# Patient Record
Sex: Female | Born: 1935 | Race: White | Hispanic: No | Smoking: Former smoker
Health system: Southern US, Community
[De-identification: ages and names within clinical notes are randomized; demographics above are authoritative.]

## PROBLEM LIST (undated history)

## (undated) DIAGNOSIS — C19 Malignant neoplasm of rectosigmoid junction: Secondary | ICD-10-CM

## (undated) DIAGNOSIS — Z86010 Personal history of colonic polyps: Secondary | ICD-10-CM

## (undated) DIAGNOSIS — K449 Diaphragmatic hernia without obstruction or gangrene: Secondary | ICD-10-CM

## (undated) DIAGNOSIS — H269 Unspecified cataract: Secondary | ICD-10-CM

## (undated) DIAGNOSIS — D649 Anemia, unspecified: Secondary | ICD-10-CM

## (undated) DIAGNOSIS — Z860101 Personal history of adenomatous and serrated colon polyps: Secondary | ICD-10-CM

## (undated) HISTORY — PX: COLON RESECTION: SHX5231

## (undated) HISTORY — DX: Anemia, unspecified: D64.9

## (undated) HISTORY — DX: Personal history of adenomatous and serrated colon polyps: Z86.0101

## (undated) HISTORY — PX: VAGINAL HYSTERECTOMY: SUR661

## (undated) HISTORY — DX: Malignant neoplasm of rectosigmoid junction: C19

## (undated) HISTORY — PX: RETINAL DETACHMENT SURGERY: SHX105

## (undated) HISTORY — DX: Diaphragmatic hernia without obstruction or gangrene: K44.9

## (undated) HISTORY — DX: Unspecified cataract: H26.9

## (undated) HISTORY — DX: Personal history of colonic polyps: Z86.010

---

## 1999-04-17 ENCOUNTER — Encounter: Payer: Self-pay | Admitting: Geriatric Medicine

## 1999-04-17 ENCOUNTER — Encounter: Admission: RE | Admit: 1999-04-17 | Discharge: 1999-04-17 | Payer: Self-pay | Admitting: Geriatric Medicine

## 1999-05-08 ENCOUNTER — Encounter: Payer: Self-pay | Admitting: Geriatric Medicine

## 1999-05-08 ENCOUNTER — Encounter: Admission: RE | Admit: 1999-05-08 | Discharge: 1999-05-08 | Payer: Self-pay | Admitting: Geriatric Medicine

## 2001-05-29 ENCOUNTER — Encounter: Admission: RE | Admit: 2001-05-29 | Discharge: 2001-05-29 | Payer: Self-pay | Admitting: Geriatric Medicine

## 2001-05-29 ENCOUNTER — Encounter: Payer: Self-pay | Admitting: Geriatric Medicine

## 2001-07-08 ENCOUNTER — Encounter: Payer: Self-pay | Admitting: Orthopedic Surgery

## 2001-07-08 ENCOUNTER — Encounter: Admission: RE | Admit: 2001-07-08 | Discharge: 2001-07-08 | Payer: Self-pay | Admitting: Orthopedic Surgery

## 2003-12-28 ENCOUNTER — Encounter: Admission: RE | Admit: 2003-12-28 | Discharge: 2003-12-28 | Payer: Self-pay | Admitting: Geriatric Medicine

## 2004-03-02 ENCOUNTER — Encounter: Admission: RE | Admit: 2004-03-02 | Discharge: 2004-03-02 | Payer: Self-pay | Admitting: Geriatric Medicine

## 2004-09-05 ENCOUNTER — Encounter: Admission: RE | Admit: 2004-09-05 | Discharge: 2004-09-05 | Payer: Self-pay | Admitting: Geriatric Medicine

## 2005-04-30 ENCOUNTER — Ambulatory Visit (HOSPITAL_COMMUNITY): Admission: RE | Admit: 2005-04-30 | Discharge: 2005-04-30 | Payer: Self-pay | Admitting: Geriatric Medicine

## 2006-02-04 IMAGING — CT CT PELVIS W/ CM
1 of 4 series · 13 of 32 positions shown, 18 images · IV contrast (redicat/h20 & omnipaque [ID])
Comparison: none

CLINICAL DATA: Follow up liver lesion. 
CT ABDOMEN AND PELVIS W/CONTRAST:
TECHNIQUE: Multidetector CT imaging of the abdomen and pelvis was performed following the standard protocol during bolus administration of intravenous contrast.
Contrast:  125 cc Omnipaque 300. 
CT ABDOMEN W/CONTRAST: 
Multidetector helical scans were performed after oral and IV contrast media were given.  This study is compared to images through the liver from CT angio chest of 12/28/03.  As noted on the prior study there are two small sub-cm low attenuation structures in the right lobe of liver.  These are consistent with benign process such as small cysts but are too small to characterize.  These have not changed in the interval.  No additional liver lesion is seen.  No ductal dilatation is noted.  On delayed images both of these areas within the right lobe of liver persist, most consistent with small cysts. 
The remainder of this study shows the lung bases to be clear.  There does appear to be a small hiatal hernia present.  No calcified gallstones are noted.  The pancreas is normal in size.  The adrenal glands and spleen also appear normal.  There is a rounded low attenuation structure in the right mid kidney.  On delayed images this appears well circumscribed and is most consistent with cyst.  No enhancement is seen.  A small low attenuation structure of only 1 cm is noted in the parenchyma of the left mid kidney as well most likely benign in origin.  The pelvocaliceal systems appear normal.  The abdominal aorta is normal in caliber.

[Series 2: — · axial · 0.97mm/px · z∈[-404,-54]mm · 13 of 80 slices shown, 18 images]
[im 5/80  soft-tissue]
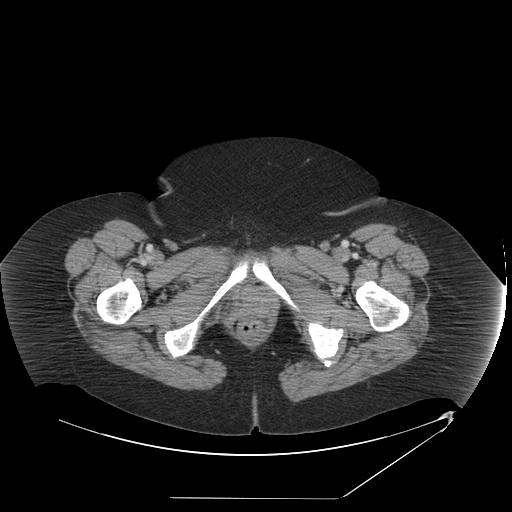
[im 5/80  bone]
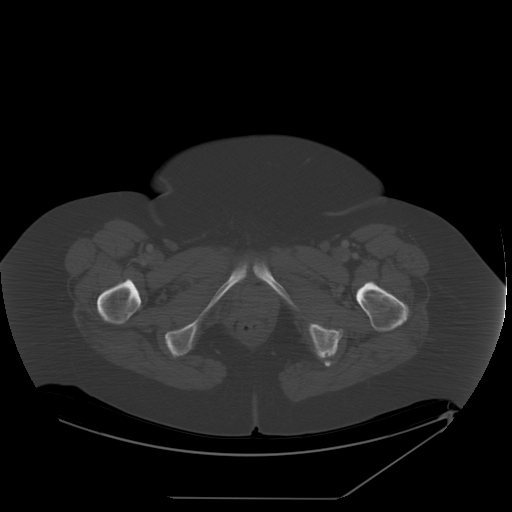
[im 14/80  soft-tissue]
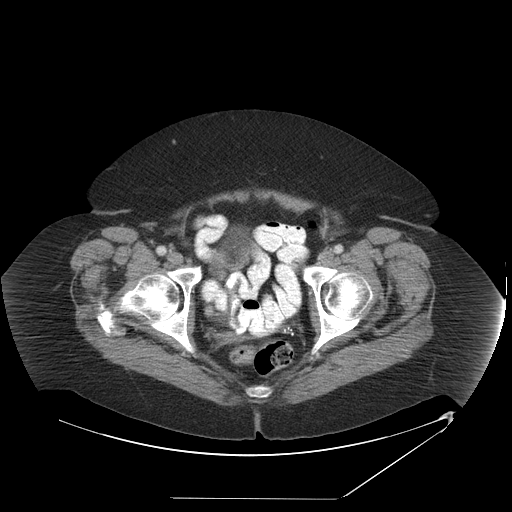
[im 19/80  soft-tissue]
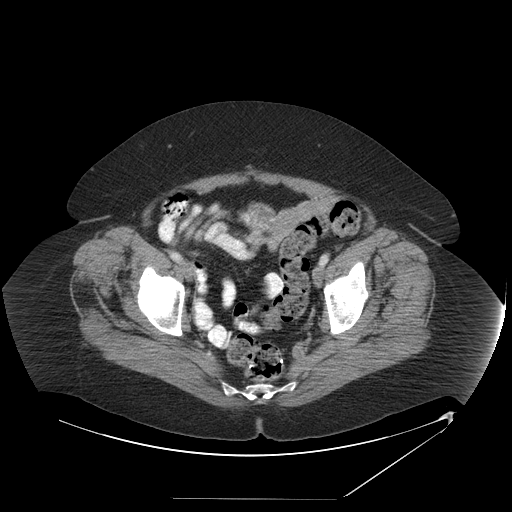
[im 24/80  soft-tissue]
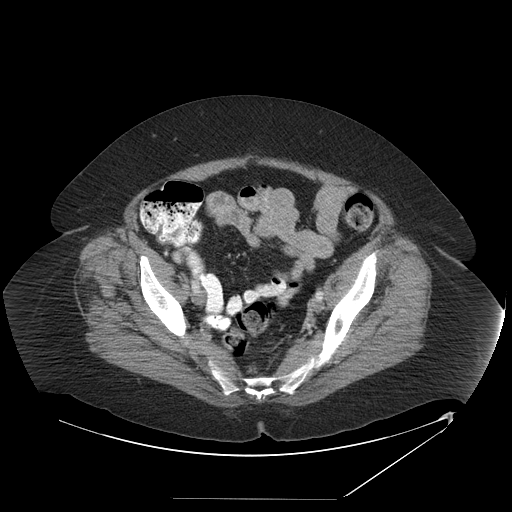
[im 33/80  soft-tissue]
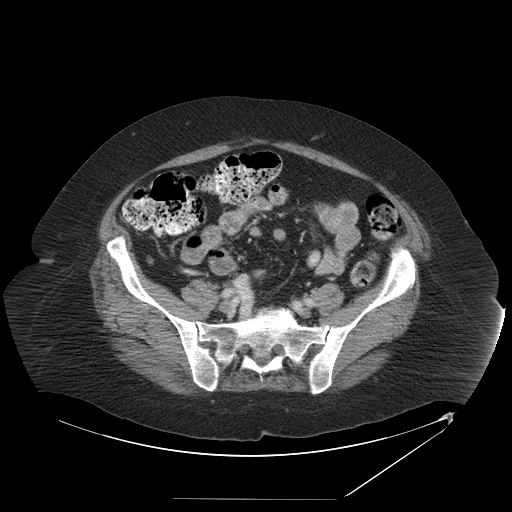
[im 38/80  soft-tissue]
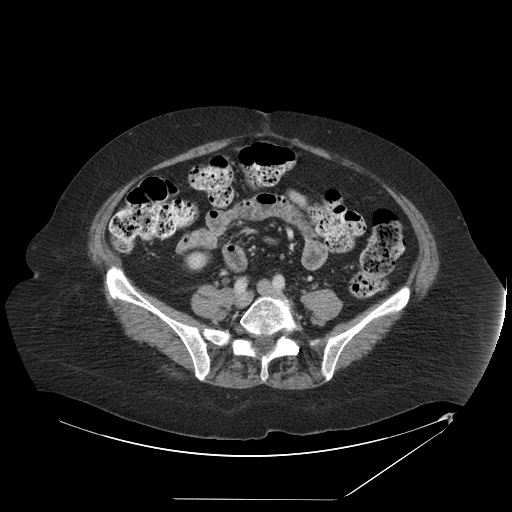
[im 42/80  soft-tissue]
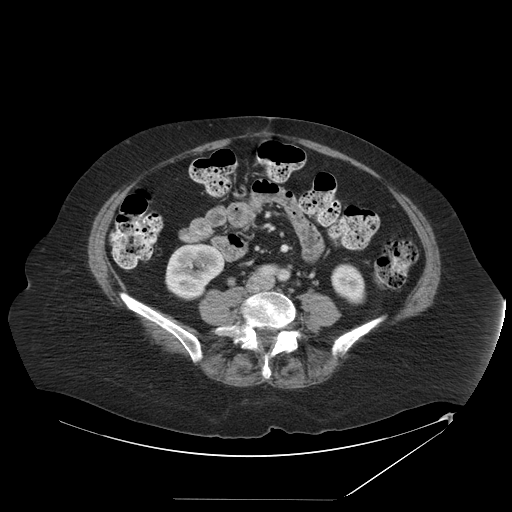
[im 52/80  soft-tissue]
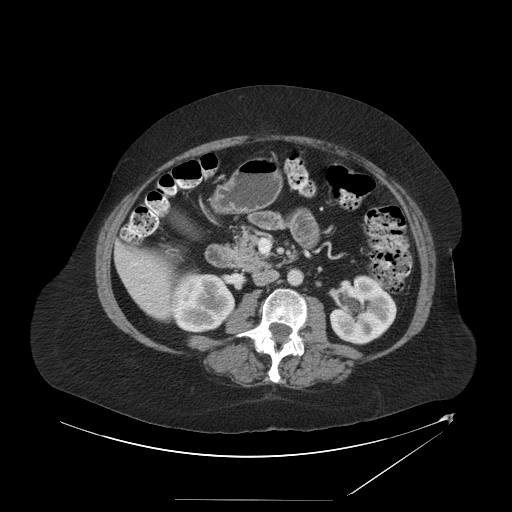
[im 56/80  soft-tissue]
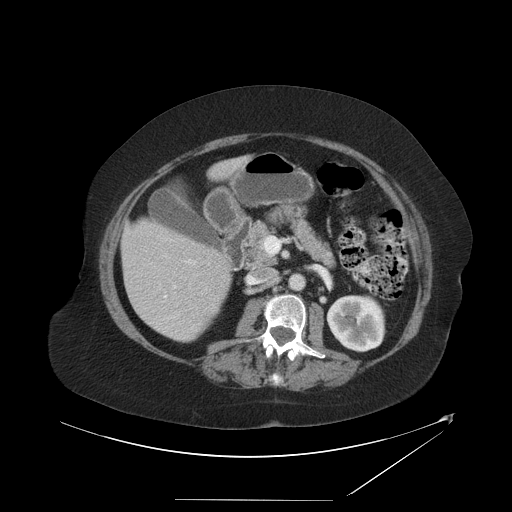
[im 56/80  bone]
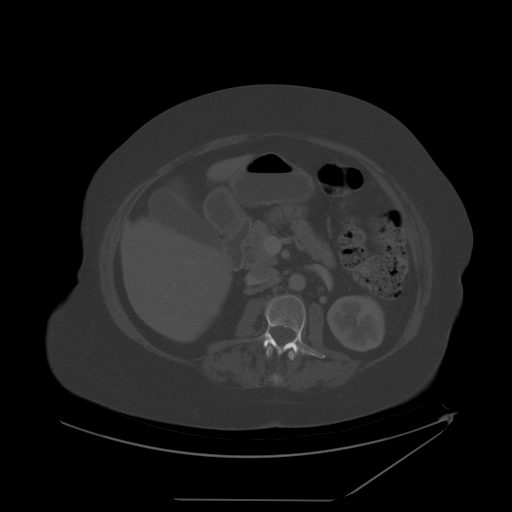
[im 61/80  soft-tissue]
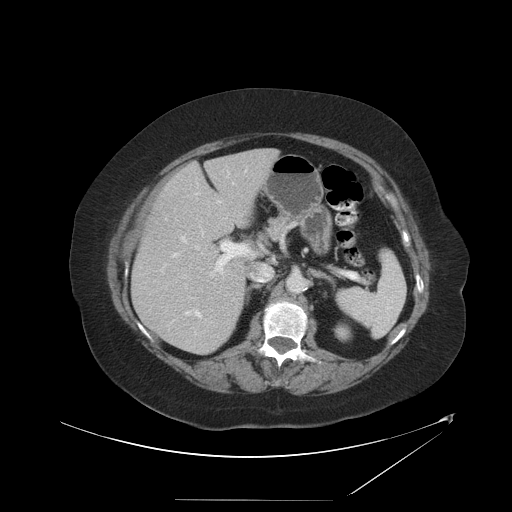
[im 61/80  lung]
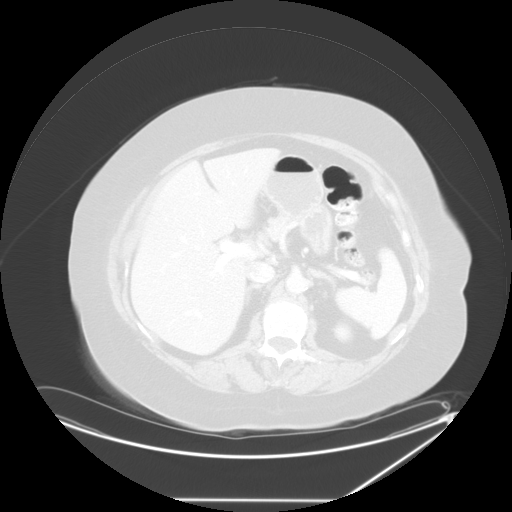
[im 66/80  lung]
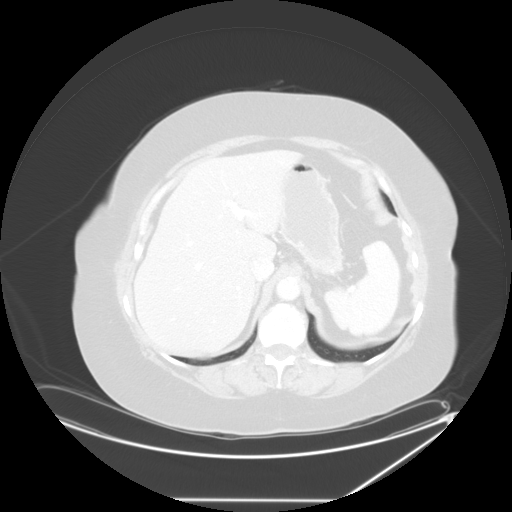
[im 70/80  soft-tissue]
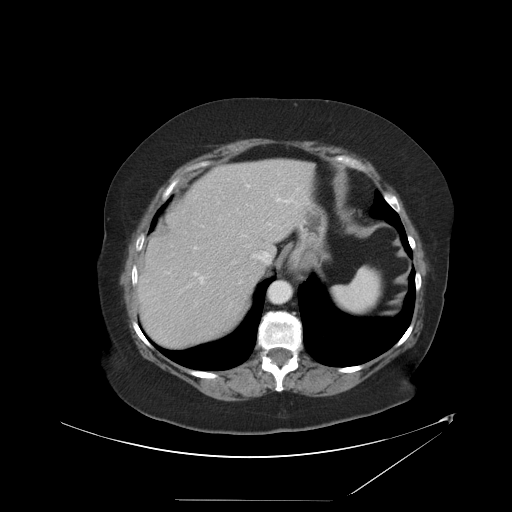
[im 70/80  lung]
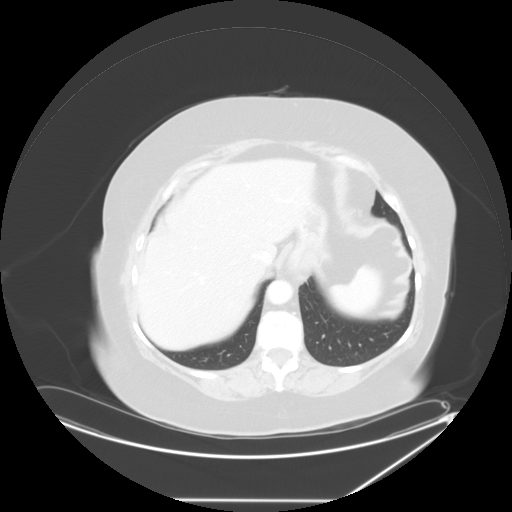
[im 75/80  soft-tissue]
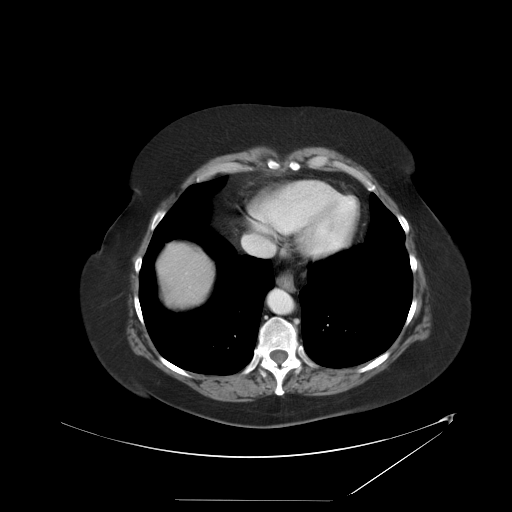
[im 75/80  lung]
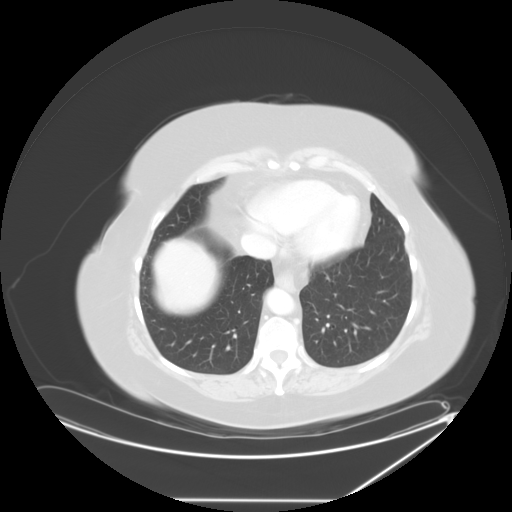

[13 of 32 positions shown; findings below may reference images not displayed]

IMPRESSION: 1.  Two sub-cm low attenuation structures in the posterior right lobe of liver are unchanged on delayed images and most consistent with benign process such as cysts. 
2.  Small hiatal hernia. 
3.  Single rounded low attenuation structures in both kidneys most consistent with cysts. 
CT PELVIS W/CONTRAST: 
Scans were continued through the pelvis after oral and IV contrast media were given.  The appendix appears normal.  Surgical sutures are noted in the rectum.  Urinary bladder is decompressed and unremarkable.  The patient has previously undergone hysterectomy.
IMPRESSION: Negative CT of the pelvis.  Appendix appears normal.

## 2006-05-02 ENCOUNTER — Ambulatory Visit (HOSPITAL_COMMUNITY): Admission: RE | Admit: 2006-05-02 | Discharge: 2006-05-02 | Payer: Self-pay | Admitting: Geriatric Medicine

## 2007-05-05 ENCOUNTER — Ambulatory Visit (HOSPITAL_COMMUNITY): Admission: RE | Admit: 2007-05-05 | Discharge: 2007-05-05 | Payer: Self-pay | Admitting: Geriatric Medicine

## 2007-07-29 ENCOUNTER — Ambulatory Visit: Payer: Self-pay | Admitting: Internal Medicine

## 2007-08-13 ENCOUNTER — Encounter: Payer: Self-pay | Admitting: Internal Medicine

## 2007-08-13 ENCOUNTER — Ambulatory Visit: Payer: Self-pay | Admitting: Internal Medicine

## 2007-08-15 ENCOUNTER — Encounter: Payer: Self-pay | Admitting: Internal Medicine

## 2008-05-16 ENCOUNTER — Ambulatory Visit (HOSPITAL_COMMUNITY): Admission: RE | Admit: 2008-05-16 | Discharge: 2008-05-16 | Payer: Self-pay | Admitting: Geriatric Medicine

## 2009-05-17 ENCOUNTER — Ambulatory Visit (HOSPITAL_COMMUNITY): Admission: RE | Admit: 2009-05-17 | Discharge: 2009-05-17 | Payer: Self-pay | Admitting: Geriatric Medicine

## 2010-04-20 ENCOUNTER — Other Ambulatory Visit (HOSPITAL_COMMUNITY): Payer: Self-pay | Admitting: Geriatric Medicine

## 2010-04-20 DIAGNOSIS — Z1231 Encounter for screening mammogram for malignant neoplasm of breast: Secondary | ICD-10-CM

## 2010-05-23 ENCOUNTER — Ambulatory Visit (HOSPITAL_COMMUNITY)
Admission: RE | Admit: 2010-05-23 | Discharge: 2010-05-23 | Disposition: A | Discharge status: Home / Self Care | Payer: Medicare Other | Source: Ambulatory Visit | Attending: Geriatric Medicine | Admitting: Geriatric Medicine

## 2010-05-23 DIAGNOSIS — Z1231 Encounter for screening mammogram for malignant neoplasm of breast: Secondary | ICD-10-CM | POA: Insufficient documentation

## 2011-01-07 ENCOUNTER — Other Ambulatory Visit: Payer: Self-pay | Admitting: Podiatry

## 2012-05-06 ENCOUNTER — Encounter (INDEPENDENT_AMBULATORY_CARE_PROVIDER_SITE_OTHER): Payer: Medicare Other | Admitting: Ophthalmology

## 2012-05-06 DIAGNOSIS — H4420 Degenerative myopia, unspecified eye: Secondary | ICD-10-CM

## 2012-05-06 DIAGNOSIS — H33309 Unspecified retinal break, unspecified eye: Secondary | ICD-10-CM

## 2012-05-06 DIAGNOSIS — H33009 Unspecified retinal detachment with retinal break, unspecified eye: Secondary | ICD-10-CM

## 2012-05-06 DIAGNOSIS — H43819 Vitreous degeneration, unspecified eye: Secondary | ICD-10-CM

## 2012-05-06 DIAGNOSIS — H2700 Aphakia, unspecified eye: Secondary | ICD-10-CM

## 2012-05-06 DIAGNOSIS — H35349 Macular cyst, hole, or pseudohole, unspecified eye: Secondary | ICD-10-CM

## 2012-06-29 ENCOUNTER — Other Ambulatory Visit (HOSPITAL_COMMUNITY): Payer: Self-pay | Admitting: Geriatric Medicine

## 2012-06-29 DIAGNOSIS — Z1231 Encounter for screening mammogram for malignant neoplasm of breast: Secondary | ICD-10-CM

## 2012-07-02 ENCOUNTER — Ambulatory Visit (HOSPITAL_COMMUNITY)
Admission: RE | Admit: 2012-07-02 | Discharge: 2012-07-02 | Disposition: A | Discharge status: Home / Self Care | Payer: Medicare Other | Source: Ambulatory Visit | Attending: Geriatric Medicine | Admitting: Geriatric Medicine

## 2012-07-02 DIAGNOSIS — Z1231 Encounter for screening mammogram for malignant neoplasm of breast: Secondary | ICD-10-CM | POA: Insufficient documentation

## 2012-07-13 ENCOUNTER — Encounter: Payer: Self-pay | Admitting: *Deleted

## 2012-07-16 ENCOUNTER — Encounter: Payer: Self-pay | Admitting: Internal Medicine

## 2012-08-04 ENCOUNTER — Encounter: Payer: Self-pay | Admitting: *Deleted

## 2012-09-01 ENCOUNTER — Encounter: Payer: Self-pay | Admitting: Internal Medicine

## 2012-09-01 ENCOUNTER — Ambulatory Visit (INDEPENDENT_AMBULATORY_CARE_PROVIDER_SITE_OTHER): Payer: Medicare Other | Admitting: Internal Medicine

## 2012-09-01 VITALS — BP 178/90 | HR 96 | Ht 64.75 in | Wt 245.1 lb

## 2012-09-01 DIAGNOSIS — Z85048 Personal history of other malignant neoplasm of rectum, rectosigmoid junction, and anus: Secondary | ICD-10-CM

## 2012-09-01 MED ORDER — MOVIPREP 100 G PO SOLR: 1.0000 | Freq: Once | ORAL | Status: DC

## 2012-09-01 NOTE — Patient Instructions (Addendum)
You have been scheduled for a colonoscopy with propofol. Please follow written instructions given to you at your visit today.  Please pick up your prep kit at the pharmacy within the next 1-3 days. If you use inhalers (even only as needed), please bring them with you on the day of your procedure. Your physician has requested that you go to www.[email or web address].com and enter the access code given to you at your visit today. This web site gives a general overview about your procedure. However, you should still follow specific instructions given to you by our office regarding your preparation for the procedure.  CC: Dr Pete Glatter

## 2012-09-01 NOTE — Progress Notes (Signed)
Holly Hawkins 08/11/1927 MRN [id number]   History of Present Illness:  This is an 79 year old white female with a history of a sigmoid carcinoma resected in 1991. She underwent subsequent colonoscopies in 1993, 1995, 1997, 1999, 2004 and 2009. She had a tubal adenoma in the cecum on her last colonoscopy. She is here todayat my request to discuss having a recall colonoscopy . She would like to go ahead and schedule her screening colonoscopy at this time. She denies any specific symptoms of rectal bleeding, abdominal pain or constipation. She has been in good health and says that her relatives lived into their 57s and that she would like to have one more colonoscopy.   Past Medical History  Diagnosis Date  . Colorectal cancer   . Hx of adenomatous colonic polyps   . Hiatal hernia    Past Surgical History  Procedure Laterality Date  . Vaginal hysterectomy    . Colon resection    . Retinal detachment surgery Bilateral     x 6    reports that she quit smoking about 54 years ago. Her smoking use included Cigarettes. She smoked 0.00 packs per day. She has never used smokeless tobacco. She reports that  drinks alcohol. She reports that she does not use illicit drugs. family history includes Colon cancer (age of onset: 18) in her mother and Diabetes in her maternal aunt and maternal grandmother. Allergies  Allergen Reactions  . Codeine     REACTION: nausea/vomiting  . Penicillins     REACTION: rash  . Sulfonamide Derivatives     REACTION: edema        Review of Systems:  The remainder of the 10 point ROS is negative except as outlined in H&P   Physical Exam: General appearance  Well developed, in no distress, Mildly overweight. Psychological normal mood and affect.  Assessment and Plan:  Problem #79 79 year old white female with a history of sigmoid adenocarcinoma. It has been 5 years since her last colonoscopy and removal of a cecal adenomatous polyp. We will go ahead with a  followup colonoscopy as per patient's own request. We have discussed the prep, the sedation and the procedure.   09/01/2012 Lina Sar

## 2012-09-16 ENCOUNTER — Encounter: Payer: Self-pay | Admitting: Internal Medicine

## 2012-09-16 ENCOUNTER — Ambulatory Visit (AMBULATORY_SURGERY_CENTER): Payer: Medicare Other | Admitting: Internal Medicine

## 2012-09-16 VITALS — BP 145/68 | HR 68 | Temp 98.3°F | Resp 14 | Ht 64.75 in | Wt 245.0 lb

## 2012-09-16 DIAGNOSIS — Z85048 Personal history of other malignant neoplasm of rectum, rectosigmoid junction, and anus: Secondary | ICD-10-CM

## 2012-09-16 MED ORDER — SODIUM CHLORIDE 0.9 % IV SOLN: 500.0000 mL | INTRAVENOUS | Status: DC

## 2012-09-16 NOTE — Op Note (Signed)
[facility] [address] [place], [address]   COLONOSCOPY PROCEDURE REPORT  PATIENT: Holly, Hawkins  MR#: [id number] BIRTHDATE: 12/18/1927 , 85  yrs. old GENDER: Female ENDOSCOPIST: Hart Carwin, MD REFERRED BY:Hal Pete Glatter, M.D. PROCEDURE DATE:  09/16/2012 PROCEDURE:   Colonoscopy, screening First Screening Colonoscopy - Avg.  risk and is 50 yrs.  old or older - No.  Prior Negative Screening - Now for repeat screening. N/A  History of Adenoma - Now for follow-up colonoscopy & has been > or = to 3 yrs.  N/A  Polyps Removed Today? Yes. ASA CLASS:   Class II INDICATIONS:High risk patient with personal history of rectosigmoid cancer and sigmoid carcinoma 1991, subsequent colonoscopiesin 1993, [id number], 2004,07/2007. MEDICATIONS: MAC sedation, administered by CRNA and propofol (Diprivan) 200mg  IV  DESCRIPTION OF PROCEDURE:   After the risks benefits and alternatives of the procedure were thoroughly explained, informed consent was obtained.  A digital rectal exam revealed no abnormalities of the rectum.   The LB [id number]-H190 [id number] and LB [id number]  endoscope was introduced through the anus and advanced to the cecum, which was identified by both the appendix and ileocecal valve. No adverse events experienced.   The quality of the prep was good, using MoviPrep  The instrument was then slowly withdrawn as the colon was fully examined.      COLON FINDINGS: There was evidence of a prior end-to-end colo-colonic surgical anastomosis in the rectosigmoid colon at 15 cm, lumen was widely open.  Retroflexed views revealed no abnormalities. The time to cecum=5 minutes 11 seconds.  Withdrawal time=6 minutes 08 seconds.  The scope was withdrawn and the procedure completed. COMPLICATIONS: There were no complications.  ENDOSCOPIC IMPRESSION: There was evidence of a prior colo-colonic surgical anastomosis in the rectosigmoid colon no evidence of recurrent  carcinoma ( resected in 1991)  RECOMMENDATIONS: High fiber diet recall colonoscopy 5 years   eSigned:  Hart Carwin, MD 09/16/2012 12:47 PM   cc:   PATIENT NAME:  Holly, Hawkins MR#: [id number]

## 2012-09-16 NOTE — Progress Notes (Signed)
Patient did not experience any of the following events: a burn prior to discharge; a fall within the facility; wrong site/side/patient/procedure/implant event; or a hospital transfer or hospital admission upon discharge from the facility. (G8907) Patient did not have preoperative order for IV antibiotic SSI prophylaxis. (G8918)  

## 2012-09-16 NOTE — Patient Instructions (Addendum)
Impressions/recommendations:  No evidence of recurrent carcimoma  HIgh Fiber diet (handout given) Repeat colonoscopy in 5 years.  YOU HAD AN ENDOSCOPIC PROCEDURE TODAY AT THE [facility]: Refer to the procedure report that was given to you for any specific questions about what was found during the examination.  If the procedure report does not answer your questions, please call your gastroenterologist to clarify.  If you requested that your care partner not be given the details of your procedure findings, then the procedure report has been included in a sealed envelope for you to review at your convenience later.  YOU SHOULD EXPECT: Some feelings of bloating in the abdomen. Passage of more gas than usual.  Walking can help get rid of the air that was put into your GI tract during the procedure and reduce the bloating. If you had a lower endoscopy (such as a colonoscopy or flexible sigmoidoscopy) you may notice spotting of blood in your stool or on the toilet paper. If you underwent a bowel prep for your procedure, then you may not have a normal bowel movement for a few days.  DIET: Your first meal following the procedure should be a light meal and then it is ok to progress to your normal diet.  A half-sandwich or bowl of soup is an example of a good first meal.  Heavy or fried foods are harder to digest and may make you feel nauseous or bloated.  Likewise meals heavy in dairy and vegetables can cause extra gas to form and this can also increase the bloating.  Drink plenty of fluids but you should avoid alcoholic beverages for 24 hours.  ACTIVITY: Your care partner should take you home directly after the procedure.  You should plan to take it easy, moving slowly for the rest of the day.  You can resume normal activity the day after the procedure however you should NOT DRIVE or use heavy machinery for 24 hours (because of the sedation medicines used during the test).    SYMPTOMS TO REPORT  IMMEDIATELY: A gastroenterologist can be reached at any hour.  During normal business hours, 8:30 AM to 5:00 PM Monday through Friday, call [phone].  After hours and on weekends, please call the GI answering service at [phone] who will take a message and have the physician on call contact you.   Following lower endoscopy (colonoscopy or flexible sigmoidoscopy):  Excessive amounts of blood in the stool  Significant tenderness or worsening of abdominal pains  Swelling of the abdomen that is new, acute  Fever of 100F or higher   FOLLOW UP: If any biopsies were taken you will be contacted by phone or by letter within the next 1-3 weeks.  Call your gastroenterologist if you have not heard about the biopsies in 3 weeks.  Our staff will call the home number listed on your records the next business day following your procedure to check on you and address any questions or concerns that you may have at that time regarding the information given to you following your procedure. This is a courtesy call and so if there is no answer at the home number and we have not heard from you through the emergency physician on call, we will assume that you have returned to your regular daily activities without incident.  SIGNATURES/CONFIDENTIALITY: You and/or your care partner have signed paperwork which will be entered into your electronic medical record.  These signatures attest to the fact that that the information  above on your After Visit Summary has been reviewed and is understood.  Full responsibility of the confidentiality of this discharge information lies with you and/or your care-partner. 

## 2012-09-17 ENCOUNTER — Telehealth: Payer: Self-pay

## 2012-09-17 NOTE — Telephone Encounter (Signed)
Left a message on the pt's answering machine for her to call us back if she has any questions or concerns #[phone]. Maw

## 2012-10-06 ENCOUNTER — Other Ambulatory Visit: Payer: Self-pay | Admitting: Dermatology

## 2015-02-23 DIAGNOSIS — H33309 Unspecified retinal break, unspecified eye: Secondary | ICD-10-CM | POA: Diagnosis not present

## 2015-02-23 DIAGNOSIS — H47291 Other optic atrophy, right eye: Secondary | ICD-10-CM | POA: Diagnosis not present

## 2015-02-23 DIAGNOSIS — H5211 Myopia, right eye: Secondary | ICD-10-CM | POA: Diagnosis not present

## 2015-02-23 DIAGNOSIS — H35341 Macular cyst, hole, or pseudohole, right eye: Secondary | ICD-10-CM | POA: Diagnosis not present

## 2015-02-23 DIAGNOSIS — H52221 Regular astigmatism, right eye: Secondary | ICD-10-CM | POA: Diagnosis not present

## 2015-03-20 ENCOUNTER — Encounter (INDEPENDENT_AMBULATORY_CARE_PROVIDER_SITE_OTHER): Payer: PPO | Admitting: Ophthalmology

## 2015-03-20 DIAGNOSIS — H43811 Vitreous degeneration, right eye: Secondary | ICD-10-CM | POA: Diagnosis not present

## 2015-03-20 DIAGNOSIS — H338 Other retinal detachments: Secondary | ICD-10-CM

## 2015-03-20 DIAGNOSIS — H35341 Macular cyst, hole, or pseudohole, right eye: Secondary | ICD-10-CM

## 2015-03-20 DIAGNOSIS — H18222 Idiopathic corneal edema, left eye: Secondary | ICD-10-CM | POA: Diagnosis not present

## 2015-03-20 DIAGNOSIS — H4423 Degenerative myopia, bilateral: Secondary | ICD-10-CM | POA: Diagnosis not present

## 2015-04-20 DIAGNOSIS — Z8669 Personal history of other diseases of the nervous system and sense organs: Secondary | ICD-10-CM | POA: Diagnosis not present

## 2015-04-20 DIAGNOSIS — H4423 Degenerative myopia, bilateral: Secondary | ICD-10-CM | POA: Diagnosis not present

## 2015-04-20 DIAGNOSIS — H35341 Macular cyst, hole, or pseudohole, right eye: Secondary | ICD-10-CM | POA: Diagnosis not present

## 2015-04-27 DIAGNOSIS — L821 Other seborrheic keratosis: Secondary | ICD-10-CM | POA: Diagnosis not present

## 2015-04-27 DIAGNOSIS — D239 Other benign neoplasm of skin, unspecified: Secondary | ICD-10-CM | POA: Diagnosis not present

## 2015-04-27 DIAGNOSIS — D2272 Melanocytic nevi of left lower limb, including hip: Secondary | ICD-10-CM | POA: Diagnosis not present

## 2015-04-27 DIAGNOSIS — Z808 Family history of malignant neoplasm of other organs or systems: Secondary | ICD-10-CM | POA: Diagnosis not present

## 2015-04-27 DIAGNOSIS — Z23 Encounter for immunization: Secondary | ICD-10-CM | POA: Diagnosis not present

## 2015-04-27 DIAGNOSIS — D225 Melanocytic nevi of trunk: Secondary | ICD-10-CM | POA: Diagnosis not present

## 2015-04-27 DIAGNOSIS — W57XXXA Bitten or stung by nonvenomous insect and other nonvenomous arthropods, initial encounter: Secondary | ICD-10-CM | POA: Diagnosis not present

## 2015-05-17 ENCOUNTER — Encounter: Payer: Self-pay | Admitting: Internal Medicine

## 2015-06-09 ENCOUNTER — Encounter (INDEPENDENT_AMBULATORY_CARE_PROVIDER_SITE_OTHER): Payer: PPO | Admitting: Ophthalmology

## 2015-06-09 DIAGNOSIS — H43813 Vitreous degeneration, bilateral: Secondary | ICD-10-CM

## 2015-06-09 DIAGNOSIS — H2703 Aphakia, bilateral: Secondary | ICD-10-CM | POA: Diagnosis not present

## 2015-06-09 DIAGNOSIS — H338 Other retinal detachments: Secondary | ICD-10-CM

## 2015-06-09 DIAGNOSIS — H4423 Degenerative myopia, bilateral: Secondary | ICD-10-CM | POA: Diagnosis not present

## 2015-06-09 DIAGNOSIS — H35341 Macular cyst, hole, or pseudohole, right eye: Secondary | ICD-10-CM | POA: Diagnosis not present

## 2015-07-24 ENCOUNTER — Encounter (INDEPENDENT_AMBULATORY_CARE_PROVIDER_SITE_OTHER): Payer: PPO | Admitting: Ophthalmology

## 2015-07-24 DIAGNOSIS — H4423 Degenerative myopia, bilateral: Secondary | ICD-10-CM | POA: Diagnosis not present

## 2015-07-24 DIAGNOSIS — H338 Other retinal detachments: Secondary | ICD-10-CM

## 2015-07-24 DIAGNOSIS — H43813 Vitreous degeneration, bilateral: Secondary | ICD-10-CM | POA: Diagnosis not present

## 2015-07-24 DIAGNOSIS — H35341 Macular cyst, hole, or pseudohole, right eye: Secondary | ICD-10-CM

## 2015-08-03 DIAGNOSIS — H18422 Band keratopathy, left eye: Secondary | ICD-10-CM | POA: Diagnosis not present

## 2015-08-03 DIAGNOSIS — H1812 Bullous keratopathy, left eye: Secondary | ICD-10-CM | POA: Diagnosis not present

## 2015-08-03 DIAGNOSIS — H16402 Unspecified corneal neovascularization, left eye: Secondary | ICD-10-CM | POA: Diagnosis not present

## 2015-08-03 DIAGNOSIS — H1820 Unspecified corneal edema: Secondary | ICD-10-CM | POA: Diagnosis not present

## 2015-08-04 DIAGNOSIS — H1812 Bullous keratopathy, left eye: Secondary | ICD-10-CM | POA: Diagnosis not present

## 2015-08-10 DIAGNOSIS — H1812 Bullous keratopathy, left eye: Secondary | ICD-10-CM | POA: Diagnosis not present

## 2015-09-07 DIAGNOSIS — Z8669 Personal history of other diseases of the nervous system and sense organs: Secondary | ICD-10-CM | POA: Diagnosis not present

## 2015-09-07 DIAGNOSIS — H35341 Macular cyst, hole, or pseudohole, right eye: Secondary | ICD-10-CM | POA: Diagnosis not present

## 2015-09-28 DIAGNOSIS — Z0000 Encounter for general adult medical examination without abnormal findings: Secondary | ICD-10-CM | POA: Diagnosis not present

## 2015-09-28 DIAGNOSIS — Z6834 Body mass index (BMI) 34.0-34.9, adult: Secondary | ICD-10-CM | POA: Diagnosis not present

## 2015-09-28 DIAGNOSIS — Z79899 Other long term (current) drug therapy: Secondary | ICD-10-CM | POA: Diagnosis not present

## 2015-09-28 DIAGNOSIS — E7800 Pure hypercholesterolemia, unspecified: Secondary | ICD-10-CM | POA: Diagnosis not present

## 2015-09-28 DIAGNOSIS — Z1389 Encounter for screening for other disorder: Secondary | ICD-10-CM | POA: Diagnosis not present

## 2015-09-28 DIAGNOSIS — E669 Obesity, unspecified: Secondary | ICD-10-CM | POA: Diagnosis not present

## 2015-11-16 ENCOUNTER — Other Ambulatory Visit: Payer: Self-pay | Admitting: Geriatric Medicine

## 2015-11-16 DIAGNOSIS — Z1231 Encounter for screening mammogram for malignant neoplasm of breast: Secondary | ICD-10-CM

## 2015-12-01 ENCOUNTER — Ambulatory Visit
Admission: RE | Admit: 2015-12-01 | Discharge: 2015-12-01 | Disposition: A | Discharge status: Home / Self Care | Payer: PPO | Source: Ambulatory Visit | Attending: Geriatric Medicine | Admitting: Geriatric Medicine

## 2015-12-01 DIAGNOSIS — Z1231 Encounter for screening mammogram for malignant neoplasm of breast: Secondary | ICD-10-CM | POA: Diagnosis not present

## 2015-12-07 DIAGNOSIS — H4423 Degenerative myopia, bilateral: Secondary | ICD-10-CM | POA: Diagnosis not present

## 2015-12-07 DIAGNOSIS — H3322 Serous retinal detachment, left eye: Secondary | ICD-10-CM | POA: Diagnosis not present

## 2015-12-07 DIAGNOSIS — Z8669 Personal history of other diseases of the nervous system and sense organs: Secondary | ICD-10-CM | POA: Diagnosis not present

## 2015-12-07 DIAGNOSIS — H35341 Macular cyst, hole, or pseudohole, right eye: Secondary | ICD-10-CM | POA: Diagnosis not present

## 2015-12-07 DIAGNOSIS — H541214 Low vision right eye category 1, blindness left eye category 4: Secondary | ICD-10-CM | POA: Diagnosis not present

## 2016-05-07 DIAGNOSIS — Z808 Family history of malignant neoplasm of other organs or systems: Secondary | ICD-10-CM | POA: Diagnosis not present

## 2016-05-07 DIAGNOSIS — L820 Inflamed seborrheic keratosis: Secondary | ICD-10-CM | POA: Diagnosis not present

## 2016-05-07 DIAGNOSIS — D2272 Melanocytic nevi of left lower limb, including hip: Secondary | ICD-10-CM | POA: Diagnosis not present

## 2016-05-07 DIAGNOSIS — D225 Melanocytic nevi of trunk: Secondary | ICD-10-CM | POA: Diagnosis not present

## 2016-05-07 DIAGNOSIS — L821 Other seborrheic keratosis: Secondary | ICD-10-CM | POA: Diagnosis not present

## 2016-05-07 DIAGNOSIS — D2271 Melanocytic nevi of right lower limb, including hip: Secondary | ICD-10-CM | POA: Diagnosis not present

## 2016-05-07 DIAGNOSIS — D235 Other benign neoplasm of skin of trunk: Secondary | ICD-10-CM | POA: Diagnosis not present

## 2016-06-06 DIAGNOSIS — H35341 Macular cyst, hole, or pseudohole, right eye: Secondary | ICD-10-CM | POA: Diagnosis not present

## 2016-06-06 DIAGNOSIS — H4423 Degenerative myopia, bilateral: Secondary | ICD-10-CM | POA: Diagnosis not present

## 2016-06-06 DIAGNOSIS — Z8669 Personal history of other diseases of the nervous system and sense organs: Secondary | ICD-10-CM | POA: Diagnosis not present

## 2016-06-14 DIAGNOSIS — L259 Unspecified contact dermatitis, unspecified cause: Secondary | ICD-10-CM | POA: Diagnosis not present

## 2016-07-08 DIAGNOSIS — Z8669 Personal history of other diseases of the nervous system and sense organs: Secondary | ICD-10-CM | POA: Diagnosis not present

## 2016-07-08 DIAGNOSIS — H02413 Mechanical ptosis of bilateral eyelids: Secondary | ICD-10-CM | POA: Diagnosis not present

## 2016-07-08 DIAGNOSIS — H541214 Low vision right eye category 1, blindness left eye category 4: Secondary | ICD-10-CM | POA: Diagnosis not present

## 2016-07-08 DIAGNOSIS — H35341 Macular cyst, hole, or pseudohole, right eye: Secondary | ICD-10-CM | POA: Diagnosis not present

## 2016-08-05 DIAGNOSIS — H35041 Retinal micro-aneurysms, unspecified, right eye: Secondary | ICD-10-CM | POA: Diagnosis not present

## 2016-08-05 DIAGNOSIS — H541214 Low vision right eye category 1, blindness left eye category 4: Secondary | ICD-10-CM | POA: Diagnosis not present

## 2016-08-05 DIAGNOSIS — H53132 Sudden visual loss, left eye: Secondary | ICD-10-CM | POA: Diagnosis not present

## 2016-08-13 DIAGNOSIS — H541214 Low vision right eye category 1, blindness left eye category 4: Secondary | ICD-10-CM | POA: Diagnosis not present

## 2016-08-13 DIAGNOSIS — H35041 Retinal micro-aneurysms, unspecified, right eye: Secondary | ICD-10-CM | POA: Diagnosis not present

## 2016-08-13 DIAGNOSIS — H53132 Sudden visual loss, left eye: Secondary | ICD-10-CM | POA: Diagnosis not present

## 2016-08-19 DIAGNOSIS — H35041 Retinal micro-aneurysms, unspecified, right eye: Secondary | ICD-10-CM | POA: Diagnosis not present

## 2016-08-19 DIAGNOSIS — H541214 Low vision right eye category 1, blindness left eye category 4: Secondary | ICD-10-CM | POA: Diagnosis not present

## 2016-08-19 DIAGNOSIS — H53132 Sudden visual loss, left eye: Secondary | ICD-10-CM | POA: Diagnosis not present

## 2016-08-28 DIAGNOSIS — H541214 Low vision right eye category 1, blindness left eye category 4: Secondary | ICD-10-CM | POA: Diagnosis not present

## 2016-08-28 DIAGNOSIS — H35041 Retinal micro-aneurysms, unspecified, right eye: Secondary | ICD-10-CM | POA: Diagnosis not present

## 2016-08-28 DIAGNOSIS — H53132 Sudden visual loss, left eye: Secondary | ICD-10-CM | POA: Diagnosis not present

## 2016-09-03 DIAGNOSIS — H53132 Sudden visual loss, left eye: Secondary | ICD-10-CM | POA: Diagnosis not present

## 2016-09-03 DIAGNOSIS — H35041 Retinal micro-aneurysms, unspecified, right eye: Secondary | ICD-10-CM | POA: Diagnosis not present

## 2016-09-03 DIAGNOSIS — H541214 Low vision right eye category 1, blindness left eye category 4: Secondary | ICD-10-CM | POA: Diagnosis not present

## 2016-09-16 DIAGNOSIS — H541214 Low vision right eye category 1, blindness left eye category 4: Secondary | ICD-10-CM | POA: Diagnosis not present

## 2016-09-16 DIAGNOSIS — H35041 Retinal micro-aneurysms, unspecified, right eye: Secondary | ICD-10-CM | POA: Diagnosis not present

## 2016-09-16 DIAGNOSIS — H53132 Sudden visual loss, left eye: Secondary | ICD-10-CM | POA: Diagnosis not present

## 2016-10-07 DIAGNOSIS — Z79899 Other long term (current) drug therapy: Secondary | ICD-10-CM | POA: Diagnosis not present

## 2016-10-07 DIAGNOSIS — M79662 Pain in left lower leg: Secondary | ICD-10-CM | POA: Diagnosis not present

## 2016-10-07 DIAGNOSIS — E669 Obesity, unspecified: Secondary | ICD-10-CM | POA: Diagnosis not present

## 2016-10-07 DIAGNOSIS — Z1389 Encounter for screening for other disorder: Secondary | ICD-10-CM | POA: Diagnosis not present

## 2016-10-07 DIAGNOSIS — Z23 Encounter for immunization: Secondary | ICD-10-CM | POA: Diagnosis not present

## 2016-10-07 DIAGNOSIS — E049 Nontoxic goiter, unspecified: Secondary | ICD-10-CM | POA: Diagnosis not present

## 2016-10-07 DIAGNOSIS — Z0000 Encounter for general adult medical examination without abnormal findings: Secondary | ICD-10-CM | POA: Diagnosis not present

## 2016-10-07 DIAGNOSIS — E7800 Pure hypercholesterolemia, unspecified: Secondary | ICD-10-CM | POA: Diagnosis not present

## 2016-10-07 DIAGNOSIS — Z6835 Body mass index (BMI) 35.0-35.9, adult: Secondary | ICD-10-CM | POA: Diagnosis not present

## 2016-10-08 ENCOUNTER — Other Ambulatory Visit: Payer: Self-pay | Admitting: Geriatric Medicine

## 2016-10-08 DIAGNOSIS — E049 Nontoxic goiter, unspecified: Secondary | ICD-10-CM

## 2016-10-15 ENCOUNTER — Ambulatory Visit
Admission: RE | Admit: 2016-10-15 | Discharge: 2016-10-15 | Disposition: A | Discharge status: Home / Self Care | Payer: PPO | Source: Ambulatory Visit | Attending: Geriatric Medicine | Admitting: Geriatric Medicine

## 2016-10-15 DIAGNOSIS — E049 Nontoxic goiter, unspecified: Secondary | ICD-10-CM | POA: Diagnosis not present

## 2016-12-02 ENCOUNTER — Encounter (INDEPENDENT_AMBULATORY_CARE_PROVIDER_SITE_OTHER): Payer: PPO | Admitting: Ophthalmology

## 2016-12-03 ENCOUNTER — Other Ambulatory Visit: Payer: Self-pay | Admitting: Geriatric Medicine

## 2016-12-03 DIAGNOSIS — Z1231 Encounter for screening mammogram for malignant neoplasm of breast: Secondary | ICD-10-CM

## 2016-12-04 ENCOUNTER — Encounter (INDEPENDENT_AMBULATORY_CARE_PROVIDER_SITE_OTHER): Payer: PPO | Admitting: Ophthalmology

## 2016-12-04 DIAGNOSIS — H338 Other retinal detachments: Secondary | ICD-10-CM | POA: Diagnosis not present

## 2016-12-04 DIAGNOSIS — H4423 Degenerative myopia, bilateral: Secondary | ICD-10-CM | POA: Diagnosis not present

## 2016-12-04 DIAGNOSIS — H43813 Vitreous degeneration, bilateral: Secondary | ICD-10-CM

## 2016-12-04 DIAGNOSIS — H2703 Aphakia, bilateral: Secondary | ICD-10-CM

## 2016-12-04 DIAGNOSIS — H35341 Macular cyst, hole, or pseudohole, right eye: Secondary | ICD-10-CM

## 2016-12-11 DIAGNOSIS — L820 Inflamed seborrheic keratosis: Secondary | ICD-10-CM | POA: Diagnosis not present

## 2016-12-11 DIAGNOSIS — L821 Other seborrheic keratosis: Secondary | ICD-10-CM | POA: Diagnosis not present

## 2016-12-11 DIAGNOSIS — L570 Actinic keratosis: Secondary | ICD-10-CM | POA: Diagnosis not present

## 2017-01-02 ENCOUNTER — Ambulatory Visit
Admission: RE | Admit: 2017-01-02 | Discharge: 2017-01-02 | Disposition: A | Discharge status: Home / Self Care | Payer: PPO | Source: Ambulatory Visit | Attending: Geriatric Medicine | Admitting: Geriatric Medicine

## 2017-01-02 DIAGNOSIS — Z1231 Encounter for screening mammogram for malignant neoplasm of breast: Secondary | ICD-10-CM

## 2017-05-15 DIAGNOSIS — Z808 Family history of malignant neoplasm of other organs or systems: Secondary | ICD-10-CM | POA: Diagnosis not present

## 2017-05-15 DIAGNOSIS — D235 Other benign neoplasm of skin of trunk: Secondary | ICD-10-CM | POA: Diagnosis not present

## 2017-05-15 DIAGNOSIS — D2271 Melanocytic nevi of right lower limb, including hip: Secondary | ICD-10-CM | POA: Diagnosis not present

## 2017-05-15 DIAGNOSIS — L570 Actinic keratosis: Secondary | ICD-10-CM | POA: Diagnosis not present

## 2017-05-15 DIAGNOSIS — D225 Melanocytic nevi of trunk: Secondary | ICD-10-CM | POA: Diagnosis not present

## 2017-05-15 DIAGNOSIS — D2272 Melanocytic nevi of left lower limb, including hip: Secondary | ICD-10-CM | POA: Diagnosis not present

## 2017-06-05 ENCOUNTER — Encounter (INDEPENDENT_AMBULATORY_CARE_PROVIDER_SITE_OTHER): Payer: PPO | Admitting: Ophthalmology

## 2017-06-05 DIAGNOSIS — H2703 Aphakia, bilateral: Secondary | ICD-10-CM

## 2017-06-05 DIAGNOSIS — H43813 Vitreous degeneration, bilateral: Secondary | ICD-10-CM

## 2017-06-05 DIAGNOSIS — H35341 Macular cyst, hole, or pseudohole, right eye: Secondary | ICD-10-CM | POA: Diagnosis not present

## 2017-06-05 DIAGNOSIS — H338 Other retinal detachments: Secondary | ICD-10-CM

## 2017-06-05 DIAGNOSIS — H4423 Degenerative myopia, bilateral: Secondary | ICD-10-CM | POA: Diagnosis not present

## 2017-07-08 ENCOUNTER — Encounter: Payer: Self-pay | Admitting: Internal Medicine

## 2017-10-13 ENCOUNTER — Other Ambulatory Visit: Payer: Self-pay | Admitting: Geriatric Medicine

## 2017-10-13 DIAGNOSIS — Z23 Encounter for immunization: Secondary | ICD-10-CM | POA: Diagnosis not present

## 2017-10-13 DIAGNOSIS — Z1389 Encounter for screening for other disorder: Secondary | ICD-10-CM | POA: Diagnosis not present

## 2017-10-13 DIAGNOSIS — Z0000 Encounter for general adult medical examination without abnormal findings: Secondary | ICD-10-CM | POA: Diagnosis not present

## 2017-10-13 DIAGNOSIS — E041 Nontoxic single thyroid nodule: Secondary | ICD-10-CM | POA: Diagnosis not present

## 2017-10-13 DIAGNOSIS — R011 Cardiac murmur, unspecified: Secondary | ICD-10-CM | POA: Diagnosis not present

## 2017-10-13 DIAGNOSIS — Z79899 Other long term (current) drug therapy: Secondary | ICD-10-CM | POA: Diagnosis not present

## 2017-10-13 DIAGNOSIS — E669 Obesity, unspecified: Secondary | ICD-10-CM | POA: Diagnosis not present

## 2017-10-15 ENCOUNTER — Other Ambulatory Visit: Payer: PPO

## 2017-10-16 ENCOUNTER — Other Ambulatory Visit (HOSPITAL_COMMUNITY): Payer: Self-pay | Admitting: Geriatric Medicine

## 2017-10-16 ENCOUNTER — Encounter (INDEPENDENT_AMBULATORY_CARE_PROVIDER_SITE_OTHER): Payer: Self-pay

## 2017-10-16 ENCOUNTER — Ambulatory Visit
Admission: RE | Admit: 2017-10-16 | Discharge: 2017-10-16 | Disposition: A | Discharge status: Home / Self Care | Payer: PPO | Source: Ambulatory Visit | Attending: Geriatric Medicine | Admitting: Geriatric Medicine

## 2017-10-16 ENCOUNTER — Other Ambulatory Visit: Payer: Self-pay

## 2017-10-16 ENCOUNTER — Ambulatory Visit (HOSPITAL_COMMUNITY): Payer: PPO | Attending: Cardiology

## 2017-10-16 DIAGNOSIS — E041 Nontoxic single thyroid nodule: Secondary | ICD-10-CM | POA: Diagnosis not present

## 2017-10-16 DIAGNOSIS — E669 Obesity, unspecified: Secondary | ICD-10-CM | POA: Insufficient documentation

## 2017-10-16 DIAGNOSIS — I080 Rheumatic disorders of both mitral and aortic valves: Secondary | ICD-10-CM | POA: Diagnosis not present

## 2017-10-16 DIAGNOSIS — Z6841 Body Mass Index (BMI) 40.0 and over, adult: Secondary | ICD-10-CM | POA: Insufficient documentation

## 2017-10-16 DIAGNOSIS — R011 Cardiac murmur, unspecified: Secondary | ICD-10-CM

## 2017-12-18 ENCOUNTER — Encounter (INDEPENDENT_AMBULATORY_CARE_PROVIDER_SITE_OTHER): Payer: PPO | Admitting: Ophthalmology

## 2017-12-18 DIAGNOSIS — H43813 Vitreous degeneration, bilateral: Secondary | ICD-10-CM | POA: Diagnosis not present

## 2017-12-18 DIAGNOSIS — H35341 Macular cyst, hole, or pseudohole, right eye: Secondary | ICD-10-CM

## 2017-12-18 DIAGNOSIS — H2703 Aphakia, bilateral: Secondary | ICD-10-CM | POA: Diagnosis not present

## 2017-12-18 DIAGNOSIS — H338 Other retinal detachments: Secondary | ICD-10-CM | POA: Diagnosis not present

## 2018-01-29 DIAGNOSIS — H33321 Round hole, right eye: Secondary | ICD-10-CM | POA: Diagnosis not present

## 2018-01-29 DIAGNOSIS — H52221 Regular astigmatism, right eye: Secondary | ICD-10-CM | POA: Diagnosis not present

## 2018-01-29 DIAGNOSIS — H33301 Unspecified retinal break, right eye: Secondary | ICD-10-CM | POA: Diagnosis not present

## 2018-01-29 DIAGNOSIS — H35371 Puckering of macula, right eye: Secondary | ICD-10-CM | POA: Diagnosis not present

## 2018-01-29 DIAGNOSIS — H35341 Macular cyst, hole, or pseudohole, right eye: Secondary | ICD-10-CM | POA: Diagnosis not present

## 2018-01-29 DIAGNOSIS — H5211 Myopia, right eye: Secondary | ICD-10-CM | POA: Diagnosis not present

## 2018-02-16 DIAGNOSIS — R69 Illness, unspecified: Secondary | ICD-10-CM | POA: Diagnosis not present

## 2018-03-31 DIAGNOSIS — Z0100 Encounter for examination of eyes and vision without abnormal findings: Secondary | ICD-10-CM | POA: Diagnosis not present

## 2018-09-17 ENCOUNTER — Encounter (INDEPENDENT_AMBULATORY_CARE_PROVIDER_SITE_OTHER): Payer: PPO | Admitting: Ophthalmology

## 2018-09-17 ENCOUNTER — Other Ambulatory Visit: Payer: Self-pay

## 2018-09-17 DIAGNOSIS — H35341 Macular cyst, hole, or pseudohole, right eye: Secondary | ICD-10-CM | POA: Diagnosis not present

## 2018-09-17 DIAGNOSIS — H4423 Degenerative myopia, bilateral: Secondary | ICD-10-CM

## 2018-09-17 DIAGNOSIS — H2703 Aphakia, bilateral: Secondary | ICD-10-CM | POA: Diagnosis not present

## 2018-09-17 DIAGNOSIS — H338 Other retinal detachments: Secondary | ICD-10-CM | POA: Diagnosis not present

## 2018-10-16 DIAGNOSIS — R69 Illness, unspecified: Secondary | ICD-10-CM | POA: Diagnosis not present

## 2018-12-01 ENCOUNTER — Other Ambulatory Visit: Payer: Self-pay | Admitting: Geriatric Medicine

## 2018-12-01 DIAGNOSIS — Z79899 Other long term (current) drug therapy: Secondary | ICD-10-CM | POA: Diagnosis not present

## 2018-12-01 DIAGNOSIS — E041 Nontoxic single thyroid nodule: Secondary | ICD-10-CM

## 2018-12-01 DIAGNOSIS — R030 Elevated blood-pressure reading, without diagnosis of hypertension: Secondary | ICD-10-CM | POA: Diagnosis not present

## 2018-12-01 DIAGNOSIS — M25562 Pain in left knee: Secondary | ICD-10-CM | POA: Diagnosis not present

## 2018-12-01 DIAGNOSIS — Z0000 Encounter for general adult medical examination without abnormal findings: Secondary | ICD-10-CM | POA: Diagnosis not present

## 2018-12-01 DIAGNOSIS — Z1389 Encounter for screening for other disorder: Secondary | ICD-10-CM | POA: Diagnosis not present

## 2018-12-01 DIAGNOSIS — I872 Venous insufficiency (chronic) (peripheral): Secondary | ICD-10-CM | POA: Diagnosis not present

## 2018-12-01 DIAGNOSIS — Z1231 Encounter for screening mammogram for malignant neoplasm of breast: Secondary | ICD-10-CM

## 2018-12-01 DIAGNOSIS — E7800 Pure hypercholesterolemia, unspecified: Secondary | ICD-10-CM | POA: Diagnosis not present

## 2018-12-07 ENCOUNTER — Ambulatory Visit
Admission: RE | Admit: 2018-12-07 | Discharge: 2018-12-07 | Disposition: A | Discharge status: Home / Self Care | Payer: PPO | Source: Ambulatory Visit | Attending: Geriatric Medicine | Admitting: Geriatric Medicine

## 2018-12-07 DIAGNOSIS — E042 Nontoxic multinodular goiter: Secondary | ICD-10-CM | POA: Diagnosis not present

## 2018-12-07 DIAGNOSIS — E041 Nontoxic single thyroid nodule: Secondary | ICD-10-CM

## 2019-01-20 ENCOUNTER — Ambulatory Visit: Payer: PPO

## 2019-02-22 ENCOUNTER — Ambulatory Visit: Payer: Medicare HMO

## 2019-04-02 ENCOUNTER — Ambulatory Visit: Payer: Medicare HMO

## 2019-05-10 ENCOUNTER — Ambulatory Visit
Admission: RE | Admit: 2019-05-10 | Discharge: 2019-05-10 | Disposition: A | Discharge status: Home / Self Care | Payer: Medicare HMO | Source: Ambulatory Visit | Attending: Geriatric Medicine | Admitting: Geriatric Medicine

## 2019-05-10 ENCOUNTER — Other Ambulatory Visit: Payer: Self-pay

## 2019-05-10 DIAGNOSIS — Z1231 Encounter for screening mammogram for malignant neoplasm of breast: Secondary | ICD-10-CM

## 2019-05-13 DIAGNOSIS — R69 Illness, unspecified: Secondary | ICD-10-CM | POA: Diagnosis not present

## 2019-06-17 ENCOUNTER — Other Ambulatory Visit: Payer: Self-pay

## 2019-06-17 ENCOUNTER — Encounter (INDEPENDENT_AMBULATORY_CARE_PROVIDER_SITE_OTHER): Payer: Medicare HMO | Admitting: Ophthalmology

## 2019-06-17 DIAGNOSIS — H43813 Vitreous degeneration, bilateral: Secondary | ICD-10-CM | POA: Diagnosis not present

## 2019-06-17 DIAGNOSIS — H338 Other retinal detachments: Secondary | ICD-10-CM

## 2019-06-17 DIAGNOSIS — H4423 Degenerative myopia, bilateral: Secondary | ICD-10-CM

## 2019-06-17 DIAGNOSIS — H35341 Macular cyst, hole, or pseudohole, right eye: Secondary | ICD-10-CM | POA: Diagnosis not present

## 2019-06-17 DIAGNOSIS — H2703 Aphakia, bilateral: Secondary | ICD-10-CM | POA: Diagnosis not present

## 2019-06-21 DIAGNOSIS — R69 Illness, unspecified: Secondary | ICD-10-CM | POA: Diagnosis not present

## 2019-10-25 DIAGNOSIS — R69 Illness, unspecified: Secondary | ICD-10-CM | POA: Diagnosis not present

## 2019-12-02 DIAGNOSIS — R69 Illness, unspecified: Secondary | ICD-10-CM | POA: Diagnosis not present

## 2019-12-20 DIAGNOSIS — R69 Illness, unspecified: Secondary | ICD-10-CM | POA: Diagnosis not present

## 2019-12-22 DIAGNOSIS — E041 Nontoxic single thyroid nodule: Secondary | ICD-10-CM | POA: Diagnosis not present

## 2019-12-22 DIAGNOSIS — Z23 Encounter for immunization: Secondary | ICD-10-CM | POA: Diagnosis not present

## 2019-12-22 DIAGNOSIS — Z79899 Other long term (current) drug therapy: Secondary | ICD-10-CM | POA: Diagnosis not present

## 2019-12-22 DIAGNOSIS — H541213 Low vision right eye category 1, blindness left eye category 3: Secondary | ICD-10-CM | POA: Diagnosis not present

## 2019-12-22 DIAGNOSIS — Z1389 Encounter for screening for other disorder: Secondary | ICD-10-CM | POA: Diagnosis not present

## 2019-12-22 DIAGNOSIS — E7800 Pure hypercholesterolemia, unspecified: Secondary | ICD-10-CM | POA: Diagnosis not present

## 2019-12-22 DIAGNOSIS — Z0000 Encounter for general adult medical examination without abnormal findings: Secondary | ICD-10-CM | POA: Diagnosis not present

## 2019-12-22 DIAGNOSIS — M25552 Pain in left hip: Secondary | ICD-10-CM | POA: Diagnosis not present

## 2019-12-31 DIAGNOSIS — E042 Nontoxic multinodular goiter: Secondary | ICD-10-CM | POA: Diagnosis not present

## 2019-12-31 DIAGNOSIS — E041 Nontoxic single thyroid nodule: Secondary | ICD-10-CM | POA: Diagnosis not present

## 2020-03-22 ENCOUNTER — Encounter (INDEPENDENT_AMBULATORY_CARE_PROVIDER_SITE_OTHER): Payer: Medicare HMO | Admitting: Ophthalmology

## 2020-04-19 ENCOUNTER — Encounter (INDEPENDENT_AMBULATORY_CARE_PROVIDER_SITE_OTHER): Payer: Medicare HMO | Admitting: Ophthalmology

## 2020-05-11 DIAGNOSIS — R5383 Other fatigue: Secondary | ICD-10-CM | POA: Diagnosis not present

## 2020-05-11 DIAGNOSIS — R269 Unspecified abnormalities of gait and mobility: Secondary | ICD-10-CM | POA: Diagnosis not present

## 2020-05-11 DIAGNOSIS — R030 Elevated blood-pressure reading, without diagnosis of hypertension: Secondary | ICD-10-CM | POA: Diagnosis not present

## 2020-05-11 DIAGNOSIS — N393 Stress incontinence (female) (male): Secondary | ICD-10-CM | POA: Diagnosis not present

## 2020-05-11 DIAGNOSIS — H542X11 Low vision right eye category 1, low vision left eye category 1: Secondary | ICD-10-CM | POA: Diagnosis not present

## 2020-05-11 DIAGNOSIS — R413 Other amnesia: Secondary | ICD-10-CM | POA: Diagnosis not present

## 2020-05-18 DIAGNOSIS — R739 Hyperglycemia, unspecified: Secondary | ICD-10-CM | POA: Diagnosis not present

## 2020-05-18 DIAGNOSIS — G301 Alzheimer's disease with late onset: Secondary | ICD-10-CM | POA: Diagnosis not present

## 2020-05-18 DIAGNOSIS — I10 Essential (primary) hypertension: Secondary | ICD-10-CM | POA: Diagnosis not present

## 2020-05-18 DIAGNOSIS — H542X11 Low vision right eye category 1, low vision left eye category 1: Secondary | ICD-10-CM | POA: Diagnosis not present

## 2020-05-24 ENCOUNTER — Encounter (INDEPENDENT_AMBULATORY_CARE_PROVIDER_SITE_OTHER): Payer: Medicare HMO | Admitting: Ophthalmology

## 2020-06-27 DIAGNOSIS — Z23 Encounter for immunization: Secondary | ICD-10-CM | POA: Diagnosis not present

## 2020-06-27 DIAGNOSIS — I10 Essential (primary) hypertension: Secondary | ICD-10-CM | POA: Diagnosis not present

## 2020-10-05 DIAGNOSIS — I10 Essential (primary) hypertension: Secondary | ICD-10-CM | POA: Diagnosis not present

## 2020-10-05 DIAGNOSIS — Z8673 Personal history of transient ischemic attack (TIA), and cerebral infarction without residual deficits: Secondary | ICD-10-CM | POA: Diagnosis not present

## 2020-10-05 DIAGNOSIS — Z7722 Contact with and (suspected) exposure to environmental tobacco smoke (acute) (chronic): Secondary | ICD-10-CM | POA: Diagnosis not present

## 2020-10-05 DIAGNOSIS — Z008 Encounter for other general examination: Secondary | ICD-10-CM | POA: Diagnosis not present

## 2020-10-05 DIAGNOSIS — Z809 Family history of malignant neoplasm, unspecified: Secondary | ICD-10-CM | POA: Diagnosis not present

## 2020-10-05 DIAGNOSIS — N393 Stress incontinence (female) (male): Secondary | ICD-10-CM | POA: Diagnosis not present

## 2020-10-05 DIAGNOSIS — Z882 Allergy status to sulfonamides status: Secondary | ICD-10-CM | POA: Diagnosis not present

## 2020-10-05 DIAGNOSIS — R269 Unspecified abnormalities of gait and mobility: Secondary | ICD-10-CM | POA: Diagnosis not present

## 2020-10-05 DIAGNOSIS — Z6835 Body mass index (BMI) 35.0-35.9, adult: Secondary | ICD-10-CM | POA: Diagnosis not present

## 2020-11-08 DIAGNOSIS — I10 Essential (primary) hypertension: Secondary | ICD-10-CM | POA: Diagnosis not present

## 2020-12-14 ENCOUNTER — Ambulatory Visit
Admission: RE | Admit: 2020-12-14 | Discharge: 2020-12-14 | Disposition: A | Discharge status: Home / Self Care | Payer: Medicare HMO | Source: Ambulatory Visit | Attending: Geriatric Medicine | Admitting: Geriatric Medicine

## 2020-12-14 ENCOUNTER — Other Ambulatory Visit: Payer: Self-pay | Admitting: Geriatric Medicine

## 2020-12-14 DIAGNOSIS — M545 Low back pain, unspecified: Secondary | ICD-10-CM

## 2020-12-14 DIAGNOSIS — W19XXXA Unspecified fall, initial encounter: Secondary | ICD-10-CM

## 2020-12-14 DIAGNOSIS — M25551 Pain in right hip: Secondary | ICD-10-CM

## 2020-12-14 DIAGNOSIS — M8588 Other specified disorders of bone density and structure, other site: Secondary | ICD-10-CM | POA: Diagnosis not present

## 2020-12-14 DIAGNOSIS — M25512 Pain in left shoulder: Secondary | ICD-10-CM

## 2020-12-14 DIAGNOSIS — R269 Unspecified abnormalities of gait and mobility: Secondary | ICD-10-CM | POA: Diagnosis not present

## 2020-12-14 DIAGNOSIS — I10 Essential (primary) hypertension: Secondary | ICD-10-CM | POA: Diagnosis not present

## 2020-12-14 DIAGNOSIS — Y92009 Unspecified place in unspecified non-institutional (private) residence as the place of occurrence of the external cause: Secondary | ICD-10-CM

## 2020-12-15 DIAGNOSIS — I872 Venous insufficiency (chronic) (peripheral): Secondary | ICD-10-CM | POA: Diagnosis not present

## 2020-12-15 DIAGNOSIS — Z9049 Acquired absence of other specified parts of digestive tract: Secondary | ICD-10-CM | POA: Diagnosis not present

## 2020-12-15 DIAGNOSIS — F02A0 Dementia in other diseases classified elsewhere, mild, without behavioral disturbance, psychotic disturbance, mood disturbance, and anxiety: Secondary | ICD-10-CM | POA: Diagnosis not present

## 2020-12-15 DIAGNOSIS — I080 Rheumatic disorders of both mitral and aortic valves: Secondary | ICD-10-CM | POA: Diagnosis not present

## 2020-12-15 DIAGNOSIS — I10 Essential (primary) hypertension: Secondary | ICD-10-CM | POA: Diagnosis not present

## 2020-12-15 DIAGNOSIS — M25551 Pain in right hip: Secondary | ICD-10-CM | POA: Diagnosis not present

## 2020-12-15 DIAGNOSIS — Z90710 Acquired absence of both cervix and uterus: Secondary | ICD-10-CM | POA: Diagnosis not present

## 2020-12-15 DIAGNOSIS — Z87891 Personal history of nicotine dependence: Secondary | ICD-10-CM | POA: Diagnosis not present

## 2020-12-15 DIAGNOSIS — I890 Lymphedema, not elsewhere classified: Secondary | ICD-10-CM | POA: Diagnosis not present

## 2020-12-15 DIAGNOSIS — M25512 Pain in left shoulder: Secondary | ICD-10-CM | POA: Diagnosis not present

## 2020-12-15 DIAGNOSIS — R7303 Prediabetes: Secondary | ICD-10-CM | POA: Diagnosis not present

## 2020-12-15 DIAGNOSIS — E041 Nontoxic single thyroid nodule: Secondary | ICD-10-CM | POA: Diagnosis not present

## 2020-12-15 DIAGNOSIS — K7689 Other specified diseases of liver: Secondary | ICD-10-CM | POA: Diagnosis not present

## 2020-12-15 DIAGNOSIS — R69 Illness, unspecified: Secondary | ICD-10-CM | POA: Diagnosis not present

## 2020-12-15 DIAGNOSIS — E7800 Pure hypercholesterolemia, unspecified: Secondary | ICD-10-CM | POA: Diagnosis not present

## 2020-12-15 DIAGNOSIS — K449 Diaphragmatic hernia without obstruction or gangrene: Secondary | ICD-10-CM | POA: Diagnosis not present

## 2020-12-15 DIAGNOSIS — D649 Anemia, unspecified: Secondary | ICD-10-CM | POA: Diagnosis not present

## 2020-12-15 DIAGNOSIS — Z86010 Personal history of colonic polyps: Secondary | ICD-10-CM | POA: Diagnosis not present

## 2020-12-15 DIAGNOSIS — H35341 Macular cyst, hole, or pseudohole, right eye: Secondary | ICD-10-CM | POA: Diagnosis not present

## 2020-12-15 DIAGNOSIS — Z85038 Personal history of other malignant neoplasm of large intestine: Secondary | ICD-10-CM | POA: Diagnosis not present

## 2020-12-15 DIAGNOSIS — M545 Low back pain, unspecified: Secondary | ICD-10-CM | POA: Diagnosis not present

## 2020-12-15 DIAGNOSIS — I4510 Unspecified right bundle-branch block: Secondary | ICD-10-CM | POA: Diagnosis not present

## 2020-12-15 DIAGNOSIS — G301 Alzheimer's disease with late onset: Secondary | ICD-10-CM | POA: Diagnosis not present

## 2020-12-15 DIAGNOSIS — H543 Unqualified visual loss, both eyes: Secondary | ICD-10-CM | POA: Diagnosis not present

## 2020-12-18 DIAGNOSIS — G301 Alzheimer's disease with late onset: Secondary | ICD-10-CM | POA: Diagnosis not present

## 2020-12-18 DIAGNOSIS — I10 Essential (primary) hypertension: Secondary | ICD-10-CM | POA: Diagnosis not present

## 2020-12-18 DIAGNOSIS — R69 Illness, unspecified: Secondary | ICD-10-CM | POA: Diagnosis not present

## 2020-12-18 DIAGNOSIS — K449 Diaphragmatic hernia without obstruction or gangrene: Secondary | ICD-10-CM | POA: Diagnosis not present

## 2020-12-18 DIAGNOSIS — M25512 Pain in left shoulder: Secondary | ICD-10-CM | POA: Diagnosis not present

## 2020-12-18 DIAGNOSIS — I4510 Unspecified right bundle-branch block: Secondary | ICD-10-CM | POA: Diagnosis not present

## 2020-12-18 DIAGNOSIS — I080 Rheumatic disorders of both mitral and aortic valves: Secondary | ICD-10-CM | POA: Diagnosis not present

## 2020-12-18 DIAGNOSIS — M545 Low back pain, unspecified: Secondary | ICD-10-CM | POA: Diagnosis not present

## 2020-12-18 DIAGNOSIS — I872 Venous insufficiency (chronic) (peripheral): Secondary | ICD-10-CM | POA: Diagnosis not present

## 2020-12-18 DIAGNOSIS — M25551 Pain in right hip: Secondary | ICD-10-CM | POA: Diagnosis not present

## 2020-12-26 DIAGNOSIS — M545 Low back pain, unspecified: Secondary | ICD-10-CM | POA: Diagnosis not present

## 2020-12-26 DIAGNOSIS — I4510 Unspecified right bundle-branch block: Secondary | ICD-10-CM | POA: Diagnosis not present

## 2020-12-26 DIAGNOSIS — E7800 Pure hypercholesterolemia, unspecified: Secondary | ICD-10-CM | POA: Diagnosis not present

## 2020-12-26 DIAGNOSIS — I872 Venous insufficiency (chronic) (peripheral): Secondary | ICD-10-CM | POA: Diagnosis not present

## 2020-12-26 DIAGNOSIS — Z79899 Other long term (current) drug therapy: Secondary | ICD-10-CM | POA: Diagnosis not present

## 2020-12-26 DIAGNOSIS — K449 Diaphragmatic hernia without obstruction or gangrene: Secondary | ICD-10-CM | POA: Diagnosis not present

## 2020-12-26 DIAGNOSIS — I10 Essential (primary) hypertension: Secondary | ICD-10-CM | POA: Diagnosis not present

## 2020-12-26 DIAGNOSIS — M25551 Pain in right hip: Secondary | ICD-10-CM | POA: Diagnosis not present

## 2020-12-26 DIAGNOSIS — R600 Localized edema: Secondary | ICD-10-CM | POA: Diagnosis not present

## 2020-12-26 DIAGNOSIS — M25512 Pain in left shoulder: Secondary | ICD-10-CM | POA: Diagnosis not present

## 2020-12-26 DIAGNOSIS — E049 Nontoxic goiter, unspecified: Secondary | ICD-10-CM | POA: Diagnosis not present

## 2020-12-26 DIAGNOSIS — I080 Rheumatic disorders of both mitral and aortic valves: Secondary | ICD-10-CM | POA: Diagnosis not present

## 2020-12-26 DIAGNOSIS — R7303 Prediabetes: Secondary | ICD-10-CM | POA: Diagnosis not present

## 2020-12-26 DIAGNOSIS — H542X11 Low vision right eye category 1, low vision left eye category 1: Secondary | ICD-10-CM | POA: Diagnosis not present

## 2020-12-26 DIAGNOSIS — R69 Illness, unspecified: Secondary | ICD-10-CM | POA: Diagnosis not present

## 2020-12-26 DIAGNOSIS — Z0000 Encounter for general adult medical examination without abnormal findings: Secondary | ICD-10-CM | POA: Diagnosis not present

## 2020-12-26 DIAGNOSIS — G301 Alzheimer's disease with late onset: Secondary | ICD-10-CM | POA: Diagnosis not present

## 2020-12-27 DIAGNOSIS — M545 Low back pain, unspecified: Secondary | ICD-10-CM | POA: Diagnosis not present

## 2020-12-27 DIAGNOSIS — R69 Illness, unspecified: Secondary | ICD-10-CM | POA: Diagnosis not present

## 2020-12-27 DIAGNOSIS — M25551 Pain in right hip: Secondary | ICD-10-CM | POA: Diagnosis not present

## 2020-12-27 DIAGNOSIS — I080 Rheumatic disorders of both mitral and aortic valves: Secondary | ICD-10-CM | POA: Diagnosis not present

## 2020-12-27 DIAGNOSIS — I4510 Unspecified right bundle-branch block: Secondary | ICD-10-CM | POA: Diagnosis not present

## 2020-12-27 DIAGNOSIS — G301 Alzheimer's disease with late onset: Secondary | ICD-10-CM | POA: Diagnosis not present

## 2020-12-27 DIAGNOSIS — K449 Diaphragmatic hernia without obstruction or gangrene: Secondary | ICD-10-CM | POA: Diagnosis not present

## 2020-12-27 DIAGNOSIS — I872 Venous insufficiency (chronic) (peripheral): Secondary | ICD-10-CM | POA: Diagnosis not present

## 2020-12-27 DIAGNOSIS — I10 Essential (primary) hypertension: Secondary | ICD-10-CM | POA: Diagnosis not present

## 2020-12-27 DIAGNOSIS — M25512 Pain in left shoulder: Secondary | ICD-10-CM | POA: Diagnosis not present

## 2020-12-28 DIAGNOSIS — M25551 Pain in right hip: Secondary | ICD-10-CM | POA: Diagnosis not present

## 2020-12-28 DIAGNOSIS — G301 Alzheimer's disease with late onset: Secondary | ICD-10-CM | POA: Diagnosis not present

## 2020-12-28 DIAGNOSIS — M545 Low back pain, unspecified: Secondary | ICD-10-CM | POA: Diagnosis not present

## 2020-12-28 DIAGNOSIS — K449 Diaphragmatic hernia without obstruction or gangrene: Secondary | ICD-10-CM | POA: Diagnosis not present

## 2020-12-28 DIAGNOSIS — R69 Illness, unspecified: Secondary | ICD-10-CM | POA: Diagnosis not present

## 2020-12-28 DIAGNOSIS — I872 Venous insufficiency (chronic) (peripheral): Secondary | ICD-10-CM | POA: Diagnosis not present

## 2020-12-28 DIAGNOSIS — I4510 Unspecified right bundle-branch block: Secondary | ICD-10-CM | POA: Diagnosis not present

## 2020-12-28 DIAGNOSIS — I10 Essential (primary) hypertension: Secondary | ICD-10-CM | POA: Diagnosis not present

## 2020-12-28 DIAGNOSIS — I080 Rheumatic disorders of both mitral and aortic valves: Secondary | ICD-10-CM | POA: Diagnosis not present

## 2020-12-28 DIAGNOSIS — M25512 Pain in left shoulder: Secondary | ICD-10-CM | POA: Diagnosis not present

## 2020-12-29 DIAGNOSIS — M25551 Pain in right hip: Secondary | ICD-10-CM | POA: Diagnosis not present

## 2020-12-29 DIAGNOSIS — K449 Diaphragmatic hernia without obstruction or gangrene: Secondary | ICD-10-CM | POA: Diagnosis not present

## 2020-12-29 DIAGNOSIS — M545 Low back pain, unspecified: Secondary | ICD-10-CM | POA: Diagnosis not present

## 2020-12-29 DIAGNOSIS — I872 Venous insufficiency (chronic) (peripheral): Secondary | ICD-10-CM | POA: Diagnosis not present

## 2020-12-29 DIAGNOSIS — I4510 Unspecified right bundle-branch block: Secondary | ICD-10-CM | POA: Diagnosis not present

## 2020-12-29 DIAGNOSIS — G301 Alzheimer's disease with late onset: Secondary | ICD-10-CM | POA: Diagnosis not present

## 2020-12-29 DIAGNOSIS — I080 Rheumatic disorders of both mitral and aortic valves: Secondary | ICD-10-CM | POA: Diagnosis not present

## 2020-12-29 DIAGNOSIS — R69 Illness, unspecified: Secondary | ICD-10-CM | POA: Diagnosis not present

## 2020-12-29 DIAGNOSIS — M25512 Pain in left shoulder: Secondary | ICD-10-CM | POA: Diagnosis not present

## 2020-12-29 DIAGNOSIS — I10 Essential (primary) hypertension: Secondary | ICD-10-CM | POA: Diagnosis not present

## 2021-01-01 DIAGNOSIS — I10 Essential (primary) hypertension: Secondary | ICD-10-CM | POA: Diagnosis not present

## 2021-01-01 DIAGNOSIS — K449 Diaphragmatic hernia without obstruction or gangrene: Secondary | ICD-10-CM | POA: Diagnosis not present

## 2021-01-01 DIAGNOSIS — I872 Venous insufficiency (chronic) (peripheral): Secondary | ICD-10-CM | POA: Diagnosis not present

## 2021-01-01 DIAGNOSIS — M545 Low back pain, unspecified: Secondary | ICD-10-CM | POA: Diagnosis not present

## 2021-01-01 DIAGNOSIS — I4510 Unspecified right bundle-branch block: Secondary | ICD-10-CM | POA: Diagnosis not present

## 2021-01-01 DIAGNOSIS — M25551 Pain in right hip: Secondary | ICD-10-CM | POA: Diagnosis not present

## 2021-01-01 DIAGNOSIS — R69 Illness, unspecified: Secondary | ICD-10-CM | POA: Diagnosis not present

## 2021-01-01 DIAGNOSIS — G301 Alzheimer's disease with late onset: Secondary | ICD-10-CM | POA: Diagnosis not present

## 2021-01-01 DIAGNOSIS — M25512 Pain in left shoulder: Secondary | ICD-10-CM | POA: Diagnosis not present

## 2021-01-01 DIAGNOSIS — I080 Rheumatic disorders of both mitral and aortic valves: Secondary | ICD-10-CM | POA: Diagnosis not present

## 2021-01-05 DIAGNOSIS — M545 Low back pain, unspecified: Secondary | ICD-10-CM | POA: Diagnosis not present

## 2021-01-05 DIAGNOSIS — I4510 Unspecified right bundle-branch block: Secondary | ICD-10-CM | POA: Diagnosis not present

## 2021-01-05 DIAGNOSIS — G301 Alzheimer's disease with late onset: Secondary | ICD-10-CM | POA: Diagnosis not present

## 2021-01-05 DIAGNOSIS — M25551 Pain in right hip: Secondary | ICD-10-CM | POA: Diagnosis not present

## 2021-01-05 DIAGNOSIS — I080 Rheumatic disorders of both mitral and aortic valves: Secondary | ICD-10-CM | POA: Diagnosis not present

## 2021-01-05 DIAGNOSIS — I872 Venous insufficiency (chronic) (peripheral): Secondary | ICD-10-CM | POA: Diagnosis not present

## 2021-01-05 DIAGNOSIS — I10 Essential (primary) hypertension: Secondary | ICD-10-CM | POA: Diagnosis not present

## 2021-01-05 DIAGNOSIS — R69 Illness, unspecified: Secondary | ICD-10-CM | POA: Diagnosis not present

## 2021-01-05 DIAGNOSIS — M25512 Pain in left shoulder: Secondary | ICD-10-CM | POA: Diagnosis not present

## 2021-01-05 DIAGNOSIS — K449 Diaphragmatic hernia without obstruction or gangrene: Secondary | ICD-10-CM | POA: Diagnosis not present

## 2021-01-08 DIAGNOSIS — G301 Alzheimer's disease with late onset: Secondary | ICD-10-CM | POA: Diagnosis not present

## 2021-01-08 DIAGNOSIS — M25512 Pain in left shoulder: Secondary | ICD-10-CM | POA: Diagnosis not present

## 2021-01-08 DIAGNOSIS — M25551 Pain in right hip: Secondary | ICD-10-CM | POA: Diagnosis not present

## 2021-01-08 DIAGNOSIS — M545 Low back pain, unspecified: Secondary | ICD-10-CM | POA: Diagnosis not present

## 2021-01-08 DIAGNOSIS — K449 Diaphragmatic hernia without obstruction or gangrene: Secondary | ICD-10-CM | POA: Diagnosis not present

## 2021-01-08 DIAGNOSIS — I080 Rheumatic disorders of both mitral and aortic valves: Secondary | ICD-10-CM | POA: Diagnosis not present

## 2021-01-08 DIAGNOSIS — R69 Illness, unspecified: Secondary | ICD-10-CM | POA: Diagnosis not present

## 2021-01-08 DIAGNOSIS — I10 Essential (primary) hypertension: Secondary | ICD-10-CM | POA: Diagnosis not present

## 2021-01-08 DIAGNOSIS — I4510 Unspecified right bundle-branch block: Secondary | ICD-10-CM | POA: Diagnosis not present

## 2021-01-08 DIAGNOSIS — I872 Venous insufficiency (chronic) (peripheral): Secondary | ICD-10-CM | POA: Diagnosis not present

## 2021-01-09 DIAGNOSIS — K449 Diaphragmatic hernia without obstruction or gangrene: Secondary | ICD-10-CM | POA: Diagnosis not present

## 2021-01-09 DIAGNOSIS — I872 Venous insufficiency (chronic) (peripheral): Secondary | ICD-10-CM | POA: Diagnosis not present

## 2021-01-09 DIAGNOSIS — M545 Low back pain, unspecified: Secondary | ICD-10-CM | POA: Diagnosis not present

## 2021-01-09 DIAGNOSIS — I080 Rheumatic disorders of both mitral and aortic valves: Secondary | ICD-10-CM | POA: Diagnosis not present

## 2021-01-09 DIAGNOSIS — R69 Illness, unspecified: Secondary | ICD-10-CM | POA: Diagnosis not present

## 2021-01-09 DIAGNOSIS — M25512 Pain in left shoulder: Secondary | ICD-10-CM | POA: Diagnosis not present

## 2021-01-09 DIAGNOSIS — I4510 Unspecified right bundle-branch block: Secondary | ICD-10-CM | POA: Diagnosis not present

## 2021-01-09 DIAGNOSIS — G301 Alzheimer's disease with late onset: Secondary | ICD-10-CM | POA: Diagnosis not present

## 2021-01-09 DIAGNOSIS — M25551 Pain in right hip: Secondary | ICD-10-CM | POA: Diagnosis not present

## 2021-01-09 DIAGNOSIS — I10 Essential (primary) hypertension: Secondary | ICD-10-CM | POA: Diagnosis not present

## 2021-01-10 DIAGNOSIS — I080 Rheumatic disorders of both mitral and aortic valves: Secondary | ICD-10-CM | POA: Diagnosis not present

## 2021-01-10 DIAGNOSIS — I872 Venous insufficiency (chronic) (peripheral): Secondary | ICD-10-CM | POA: Diagnosis not present

## 2021-01-10 DIAGNOSIS — M25512 Pain in left shoulder: Secondary | ICD-10-CM | POA: Diagnosis not present

## 2021-01-10 DIAGNOSIS — I10 Essential (primary) hypertension: Secondary | ICD-10-CM | POA: Diagnosis not present

## 2021-01-10 DIAGNOSIS — M545 Low back pain, unspecified: Secondary | ICD-10-CM | POA: Diagnosis not present

## 2021-01-10 DIAGNOSIS — M25551 Pain in right hip: Secondary | ICD-10-CM | POA: Diagnosis not present

## 2021-01-10 DIAGNOSIS — G301 Alzheimer's disease with late onset: Secondary | ICD-10-CM | POA: Diagnosis not present

## 2021-01-10 DIAGNOSIS — K449 Diaphragmatic hernia without obstruction or gangrene: Secondary | ICD-10-CM | POA: Diagnosis not present

## 2021-01-10 DIAGNOSIS — I4510 Unspecified right bundle-branch block: Secondary | ICD-10-CM | POA: Diagnosis not present

## 2021-01-10 DIAGNOSIS — R69 Illness, unspecified: Secondary | ICD-10-CM | POA: Diagnosis not present

## 2021-01-11 DIAGNOSIS — G301 Alzheimer's disease with late onset: Secondary | ICD-10-CM | POA: Diagnosis not present

## 2021-01-11 DIAGNOSIS — I10 Essential (primary) hypertension: Secondary | ICD-10-CM | POA: Diagnosis not present

## 2021-01-11 DIAGNOSIS — I4510 Unspecified right bundle-branch block: Secondary | ICD-10-CM | POA: Diagnosis not present

## 2021-01-11 DIAGNOSIS — M545 Low back pain, unspecified: Secondary | ICD-10-CM | POA: Diagnosis not present

## 2021-01-11 DIAGNOSIS — M25551 Pain in right hip: Secondary | ICD-10-CM | POA: Diagnosis not present

## 2021-01-11 DIAGNOSIS — I080 Rheumatic disorders of both mitral and aortic valves: Secondary | ICD-10-CM | POA: Diagnosis not present

## 2021-01-11 DIAGNOSIS — K449 Diaphragmatic hernia without obstruction or gangrene: Secondary | ICD-10-CM | POA: Diagnosis not present

## 2021-01-11 DIAGNOSIS — M25512 Pain in left shoulder: Secondary | ICD-10-CM | POA: Diagnosis not present

## 2021-01-11 DIAGNOSIS — I872 Venous insufficiency (chronic) (peripheral): Secondary | ICD-10-CM | POA: Diagnosis not present

## 2021-01-11 DIAGNOSIS — R69 Illness, unspecified: Secondary | ICD-10-CM | POA: Diagnosis not present

## 2021-01-17 DIAGNOSIS — G301 Alzheimer's disease with late onset: Secondary | ICD-10-CM | POA: Diagnosis not present

## 2021-01-17 DIAGNOSIS — I4510 Unspecified right bundle-branch block: Secondary | ICD-10-CM | POA: Diagnosis not present

## 2021-01-17 DIAGNOSIS — I10 Essential (primary) hypertension: Secondary | ICD-10-CM | POA: Diagnosis not present

## 2021-01-17 DIAGNOSIS — M25512 Pain in left shoulder: Secondary | ICD-10-CM | POA: Diagnosis not present

## 2021-01-17 DIAGNOSIS — M545 Low back pain, unspecified: Secondary | ICD-10-CM | POA: Diagnosis not present

## 2021-01-17 DIAGNOSIS — R69 Illness, unspecified: Secondary | ICD-10-CM | POA: Diagnosis not present

## 2021-01-17 DIAGNOSIS — I080 Rheumatic disorders of both mitral and aortic valves: Secondary | ICD-10-CM | POA: Diagnosis not present

## 2021-01-17 DIAGNOSIS — M25551 Pain in right hip: Secondary | ICD-10-CM | POA: Diagnosis not present

## 2021-01-17 DIAGNOSIS — K449 Diaphragmatic hernia without obstruction or gangrene: Secondary | ICD-10-CM | POA: Diagnosis not present

## 2021-01-17 DIAGNOSIS — I872 Venous insufficiency (chronic) (peripheral): Secondary | ICD-10-CM | POA: Diagnosis not present

## 2021-01-24 DIAGNOSIS — K449 Diaphragmatic hernia without obstruction or gangrene: Secondary | ICD-10-CM | POA: Diagnosis not present

## 2021-01-24 DIAGNOSIS — I4510 Unspecified right bundle-branch block: Secondary | ICD-10-CM | POA: Diagnosis not present

## 2021-01-24 DIAGNOSIS — I872 Venous insufficiency (chronic) (peripheral): Secondary | ICD-10-CM | POA: Diagnosis not present

## 2021-01-24 DIAGNOSIS — I080 Rheumatic disorders of both mitral and aortic valves: Secondary | ICD-10-CM | POA: Diagnosis not present

## 2021-01-24 DIAGNOSIS — M25551 Pain in right hip: Secondary | ICD-10-CM | POA: Diagnosis not present

## 2021-01-24 DIAGNOSIS — G301 Alzheimer's disease with late onset: Secondary | ICD-10-CM | POA: Diagnosis not present

## 2021-01-24 DIAGNOSIS — R69 Illness, unspecified: Secondary | ICD-10-CM | POA: Diagnosis not present

## 2021-01-24 DIAGNOSIS — M545 Low back pain, unspecified: Secondary | ICD-10-CM | POA: Diagnosis not present

## 2021-01-24 DIAGNOSIS — M25512 Pain in left shoulder: Secondary | ICD-10-CM | POA: Diagnosis not present

## 2021-01-24 DIAGNOSIS — I10 Essential (primary) hypertension: Secondary | ICD-10-CM | POA: Diagnosis not present

## 2021-01-25 DIAGNOSIS — I080 Rheumatic disorders of both mitral and aortic valves: Secondary | ICD-10-CM | POA: Diagnosis not present

## 2021-01-25 DIAGNOSIS — M545 Low back pain, unspecified: Secondary | ICD-10-CM | POA: Diagnosis not present

## 2021-01-25 DIAGNOSIS — I4510 Unspecified right bundle-branch block: Secondary | ICD-10-CM | POA: Diagnosis not present

## 2021-01-25 DIAGNOSIS — M25551 Pain in right hip: Secondary | ICD-10-CM | POA: Diagnosis not present

## 2021-01-25 DIAGNOSIS — R69 Illness, unspecified: Secondary | ICD-10-CM | POA: Diagnosis not present

## 2021-01-25 DIAGNOSIS — I10 Essential (primary) hypertension: Secondary | ICD-10-CM | POA: Diagnosis not present

## 2021-01-25 DIAGNOSIS — G301 Alzheimer's disease with late onset: Secondary | ICD-10-CM | POA: Diagnosis not present

## 2021-01-25 DIAGNOSIS — M25512 Pain in left shoulder: Secondary | ICD-10-CM | POA: Diagnosis not present

## 2021-01-25 DIAGNOSIS — K449 Diaphragmatic hernia without obstruction or gangrene: Secondary | ICD-10-CM | POA: Diagnosis not present

## 2021-01-25 DIAGNOSIS — I872 Venous insufficiency (chronic) (peripheral): Secondary | ICD-10-CM | POA: Diagnosis not present

## 2021-01-30 DIAGNOSIS — I872 Venous insufficiency (chronic) (peripheral): Secondary | ICD-10-CM | POA: Diagnosis not present

## 2021-01-30 DIAGNOSIS — G301 Alzheimer's disease with late onset: Secondary | ICD-10-CM | POA: Diagnosis not present

## 2021-01-30 DIAGNOSIS — R69 Illness, unspecified: Secondary | ICD-10-CM | POA: Diagnosis not present

## 2021-01-30 DIAGNOSIS — I080 Rheumatic disorders of both mitral and aortic valves: Secondary | ICD-10-CM | POA: Diagnosis not present

## 2021-01-30 DIAGNOSIS — K449 Diaphragmatic hernia without obstruction or gangrene: Secondary | ICD-10-CM | POA: Diagnosis not present

## 2021-01-30 DIAGNOSIS — I4510 Unspecified right bundle-branch block: Secondary | ICD-10-CM | POA: Diagnosis not present

## 2021-01-30 DIAGNOSIS — I10 Essential (primary) hypertension: Secondary | ICD-10-CM | POA: Diagnosis not present

## 2021-01-30 DIAGNOSIS — M25551 Pain in right hip: Secondary | ICD-10-CM | POA: Diagnosis not present

## 2021-01-30 DIAGNOSIS — M545 Low back pain, unspecified: Secondary | ICD-10-CM | POA: Diagnosis not present

## 2021-01-30 DIAGNOSIS — M25512 Pain in left shoulder: Secondary | ICD-10-CM | POA: Diagnosis not present

## 2021-02-01 DIAGNOSIS — I10 Essential (primary) hypertension: Secondary | ICD-10-CM | POA: Diagnosis not present

## 2021-02-01 DIAGNOSIS — I872 Venous insufficiency (chronic) (peripheral): Secondary | ICD-10-CM | POA: Diagnosis not present

## 2021-02-01 DIAGNOSIS — M545 Low back pain, unspecified: Secondary | ICD-10-CM | POA: Diagnosis not present

## 2021-02-01 DIAGNOSIS — M25512 Pain in left shoulder: Secondary | ICD-10-CM | POA: Diagnosis not present

## 2021-02-01 DIAGNOSIS — R69 Illness, unspecified: Secondary | ICD-10-CM | POA: Diagnosis not present

## 2021-02-01 DIAGNOSIS — I4510 Unspecified right bundle-branch block: Secondary | ICD-10-CM | POA: Diagnosis not present

## 2021-02-01 DIAGNOSIS — K449 Diaphragmatic hernia without obstruction or gangrene: Secondary | ICD-10-CM | POA: Diagnosis not present

## 2021-02-01 DIAGNOSIS — I080 Rheumatic disorders of both mitral and aortic valves: Secondary | ICD-10-CM | POA: Diagnosis not present

## 2021-02-01 DIAGNOSIS — G301 Alzheimer's disease with late onset: Secondary | ICD-10-CM | POA: Diagnosis not present

## 2021-02-01 DIAGNOSIS — M25551 Pain in right hip: Secondary | ICD-10-CM | POA: Diagnosis not present

## 2021-02-07 DIAGNOSIS — M545 Low back pain, unspecified: Secondary | ICD-10-CM | POA: Diagnosis not present

## 2021-02-07 DIAGNOSIS — M25512 Pain in left shoulder: Secondary | ICD-10-CM | POA: Diagnosis not present

## 2021-02-07 DIAGNOSIS — I872 Venous insufficiency (chronic) (peripheral): Secondary | ICD-10-CM | POA: Diagnosis not present

## 2021-02-07 DIAGNOSIS — K449 Diaphragmatic hernia without obstruction or gangrene: Secondary | ICD-10-CM | POA: Diagnosis not present

## 2021-02-07 DIAGNOSIS — G301 Alzheimer's disease with late onset: Secondary | ICD-10-CM | POA: Diagnosis not present

## 2021-02-07 DIAGNOSIS — I4510 Unspecified right bundle-branch block: Secondary | ICD-10-CM | POA: Diagnosis not present

## 2021-02-07 DIAGNOSIS — M25551 Pain in right hip: Secondary | ICD-10-CM | POA: Diagnosis not present

## 2021-02-07 DIAGNOSIS — I080 Rheumatic disorders of both mitral and aortic valves: Secondary | ICD-10-CM | POA: Diagnosis not present

## 2021-02-07 DIAGNOSIS — R69 Illness, unspecified: Secondary | ICD-10-CM | POA: Diagnosis not present

## 2021-02-07 DIAGNOSIS — I10 Essential (primary) hypertension: Secondary | ICD-10-CM | POA: Diagnosis not present

## 2021-02-08 DIAGNOSIS — M545 Low back pain, unspecified: Secondary | ICD-10-CM | POA: Diagnosis not present

## 2021-02-08 DIAGNOSIS — I872 Venous insufficiency (chronic) (peripheral): Secondary | ICD-10-CM | POA: Diagnosis not present

## 2021-02-08 DIAGNOSIS — G301 Alzheimer's disease with late onset: Secondary | ICD-10-CM | POA: Diagnosis not present

## 2021-02-08 DIAGNOSIS — I080 Rheumatic disorders of both mitral and aortic valves: Secondary | ICD-10-CM | POA: Diagnosis not present

## 2021-02-08 DIAGNOSIS — M25551 Pain in right hip: Secondary | ICD-10-CM | POA: Diagnosis not present

## 2021-02-08 DIAGNOSIS — M25512 Pain in left shoulder: Secondary | ICD-10-CM | POA: Diagnosis not present

## 2021-02-08 DIAGNOSIS — I4510 Unspecified right bundle-branch block: Secondary | ICD-10-CM | POA: Diagnosis not present

## 2021-02-08 DIAGNOSIS — K449 Diaphragmatic hernia without obstruction or gangrene: Secondary | ICD-10-CM | POA: Diagnosis not present

## 2021-02-08 DIAGNOSIS — I10 Essential (primary) hypertension: Secondary | ICD-10-CM | POA: Diagnosis not present

## 2021-02-08 DIAGNOSIS — R69 Illness, unspecified: Secondary | ICD-10-CM | POA: Diagnosis not present

## 2021-02-14 DIAGNOSIS — K449 Diaphragmatic hernia without obstruction or gangrene: Secondary | ICD-10-CM | POA: Diagnosis not present

## 2021-02-14 DIAGNOSIS — I872 Venous insufficiency (chronic) (peripheral): Secondary | ICD-10-CM | POA: Diagnosis not present

## 2021-02-14 DIAGNOSIS — I4510 Unspecified right bundle-branch block: Secondary | ICD-10-CM | POA: Diagnosis not present

## 2021-02-14 DIAGNOSIS — M25551 Pain in right hip: Secondary | ICD-10-CM | POA: Diagnosis not present

## 2021-02-14 DIAGNOSIS — M545 Low back pain, unspecified: Secondary | ICD-10-CM | POA: Diagnosis not present

## 2021-02-14 DIAGNOSIS — I10 Essential (primary) hypertension: Secondary | ICD-10-CM | POA: Diagnosis not present

## 2021-02-14 DIAGNOSIS — R69 Illness, unspecified: Secondary | ICD-10-CM | POA: Diagnosis not present

## 2021-02-14 DIAGNOSIS — M25512 Pain in left shoulder: Secondary | ICD-10-CM | POA: Diagnosis not present

## 2021-02-14 DIAGNOSIS — I080 Rheumatic disorders of both mitral and aortic valves: Secondary | ICD-10-CM | POA: Diagnosis not present

## 2021-02-14 DIAGNOSIS — G301 Alzheimer's disease with late onset: Secondary | ICD-10-CM | POA: Diagnosis not present

## 2021-02-16 DIAGNOSIS — R69 Illness, unspecified: Secondary | ICD-10-CM | POA: Diagnosis not present

## 2021-02-16 DIAGNOSIS — K449 Diaphragmatic hernia without obstruction or gangrene: Secondary | ICD-10-CM | POA: Diagnosis not present

## 2021-02-16 DIAGNOSIS — I10 Essential (primary) hypertension: Secondary | ICD-10-CM | POA: Diagnosis not present

## 2021-02-16 DIAGNOSIS — M25512 Pain in left shoulder: Secondary | ICD-10-CM | POA: Diagnosis not present

## 2021-02-16 DIAGNOSIS — M25551 Pain in right hip: Secondary | ICD-10-CM | POA: Diagnosis not present

## 2021-02-16 DIAGNOSIS — G301 Alzheimer's disease with late onset: Secondary | ICD-10-CM | POA: Diagnosis not present

## 2021-02-16 DIAGNOSIS — I4510 Unspecified right bundle-branch block: Secondary | ICD-10-CM | POA: Diagnosis not present

## 2021-02-16 DIAGNOSIS — M545 Low back pain, unspecified: Secondary | ICD-10-CM | POA: Diagnosis not present

## 2021-02-16 DIAGNOSIS — I872 Venous insufficiency (chronic) (peripheral): Secondary | ICD-10-CM | POA: Diagnosis not present

## 2021-02-16 DIAGNOSIS — I080 Rheumatic disorders of both mitral and aortic valves: Secondary | ICD-10-CM | POA: Diagnosis not present

## 2021-02-20 DIAGNOSIS — M25512 Pain in left shoulder: Secondary | ICD-10-CM | POA: Diagnosis not present

## 2021-02-20 DIAGNOSIS — M545 Low back pain, unspecified: Secondary | ICD-10-CM | POA: Diagnosis not present

## 2021-02-20 DIAGNOSIS — R69 Illness, unspecified: Secondary | ICD-10-CM | POA: Diagnosis not present

## 2021-02-20 DIAGNOSIS — M25551 Pain in right hip: Secondary | ICD-10-CM | POA: Diagnosis not present

## 2021-02-20 DIAGNOSIS — G301 Alzheimer's disease with late onset: Secondary | ICD-10-CM | POA: Diagnosis not present

## 2021-02-20 DIAGNOSIS — I4510 Unspecified right bundle-branch block: Secondary | ICD-10-CM | POA: Diagnosis not present

## 2021-02-20 DIAGNOSIS — I080 Rheumatic disorders of both mitral and aortic valves: Secondary | ICD-10-CM | POA: Diagnosis not present

## 2021-02-20 DIAGNOSIS — I10 Essential (primary) hypertension: Secondary | ICD-10-CM | POA: Diagnosis not present

## 2021-02-20 DIAGNOSIS — I872 Venous insufficiency (chronic) (peripheral): Secondary | ICD-10-CM | POA: Diagnosis not present

## 2021-02-20 DIAGNOSIS — K449 Diaphragmatic hernia without obstruction or gangrene: Secondary | ICD-10-CM | POA: Diagnosis not present

## 2021-02-21 DIAGNOSIS — M25551 Pain in right hip: Secondary | ICD-10-CM | POA: Diagnosis not present

## 2021-02-21 DIAGNOSIS — M25512 Pain in left shoulder: Secondary | ICD-10-CM | POA: Diagnosis not present

## 2021-02-21 DIAGNOSIS — I4510 Unspecified right bundle-branch block: Secondary | ICD-10-CM | POA: Diagnosis not present

## 2021-02-21 DIAGNOSIS — K449 Diaphragmatic hernia without obstruction or gangrene: Secondary | ICD-10-CM | POA: Diagnosis not present

## 2021-02-21 DIAGNOSIS — M545 Low back pain, unspecified: Secondary | ICD-10-CM | POA: Diagnosis not present

## 2021-02-21 DIAGNOSIS — I10 Essential (primary) hypertension: Secondary | ICD-10-CM | POA: Diagnosis not present

## 2021-02-21 DIAGNOSIS — G301 Alzheimer's disease with late onset: Secondary | ICD-10-CM | POA: Diagnosis not present

## 2021-02-21 DIAGNOSIS — I872 Venous insufficiency (chronic) (peripheral): Secondary | ICD-10-CM | POA: Diagnosis not present

## 2021-02-21 DIAGNOSIS — I080 Rheumatic disorders of both mitral and aortic valves: Secondary | ICD-10-CM | POA: Diagnosis not present

## 2021-02-21 DIAGNOSIS — R69 Illness, unspecified: Secondary | ICD-10-CM | POA: Diagnosis not present

## 2021-02-23 DIAGNOSIS — R69 Illness, unspecified: Secondary | ICD-10-CM | POA: Diagnosis not present

## 2021-02-23 DIAGNOSIS — K449 Diaphragmatic hernia without obstruction or gangrene: Secondary | ICD-10-CM | POA: Diagnosis not present

## 2021-02-23 DIAGNOSIS — I872 Venous insufficiency (chronic) (peripheral): Secondary | ICD-10-CM | POA: Diagnosis not present

## 2021-02-23 DIAGNOSIS — M25551 Pain in right hip: Secondary | ICD-10-CM | POA: Diagnosis not present

## 2021-02-23 DIAGNOSIS — M25512 Pain in left shoulder: Secondary | ICD-10-CM | POA: Diagnosis not present

## 2021-02-23 DIAGNOSIS — I10 Essential (primary) hypertension: Secondary | ICD-10-CM | POA: Diagnosis not present

## 2021-02-23 DIAGNOSIS — M545 Low back pain, unspecified: Secondary | ICD-10-CM | POA: Diagnosis not present

## 2021-02-23 DIAGNOSIS — I4510 Unspecified right bundle-branch block: Secondary | ICD-10-CM | POA: Diagnosis not present

## 2021-02-23 DIAGNOSIS — G301 Alzheimer's disease with late onset: Secondary | ICD-10-CM | POA: Diagnosis not present

## 2021-02-23 DIAGNOSIS — I080 Rheumatic disorders of both mitral and aortic valves: Secondary | ICD-10-CM | POA: Diagnosis not present

## 2021-02-26 NOTE — Progress Notes (Signed)
[facility] [place]  ASSESSMENT / PLAN: Holly Hawkins is a 88 y.o. 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  Venous duplex today shows no greater saphenous vein reflux.  The patient needs compression wrapping, to be applied by home health nurse.  She has a normal peripheral arterial exam and does not need an ABI prior to wrapping.  She would benefit from physical therapy for lymphedema as well.  She can follow-up with me as needed.  My office will coordinate the above.  CHIEF COMPLAINT: Swollen legs  HISTORY OF PRESENT ILLNESS: Holly Hawkins is a 88 y.o. female who presents to clinic for evaluation of a several month history of severe swollen legs bilaterally.  The swelling is limiting her already limited ambulation.  She has great difficulty moving about the house and getting dressed.  She is dependent on assistance for most of her IADLs.  She reports that her mother had a similar problem, which improved with wrapping.  Past Medical History:  Diagnosis Date   Anemia    Cataract    Colorectal cancer ([place])    Hiatal hernia    Hx of adenomatous colonic polyps     Past Surgical History:  Procedure Laterality Date   COLON RESECTION     RETINAL DETACHMENT SURGERY Bilateral    x 6   VAGINAL HYSTERECTOMY      Family History  Problem Relation Age of Onset   Colon cancer Mother 89   Diabetes Maternal Grandmother    Colon cancer Maternal Grandmother    Diabetes Maternal Aunt    Colon polyps Daughter    Breast cancer Neg Hx     Social History   Socioeconomic History   Marital status: Divorced    Spouse name: Not on file   Number of children: 2   Years of education: Not on file   Highest education level: Not on file  Occupational History   Occupation: retired  Tobacco Use   Smoking status: Former    Types: Cigarettes    Quit date: 01/28/1958    Years since  quitting: 63.1   Smokeless tobacco: Never  Substance and Sexual Activity   Alcohol use: Yes    Comment: social   Drug use: No   Sexual activity: Not on file  Other Topics Concern   Not on file  Social History Narrative   Not on file   Social Determinants of Health   Financial Resource Strain: Not on file  Food Insecurity: Not on file  Transportation Needs: Not on file  Physical Activity: Not on file  Stress: Not on file  Social Connections: Not on file  Intimate Partner Violence: Not on file    Allergies  Allergen Reactions   Codeine     REACTION: nausea/vomiting   Penicillins     REACTION: rash   Sulfonamide Derivatives     REACTION: edema    Current Outpatient Medications  Medication Sig Dispense Refill   furosemide (LASIX) 20 MG tablet Take 20 mg by mouth daily.     lisinopril (ZESTRIL) 10 MG tablet Take 10 mg by mouth daily.     triamcinolone (KENALOG) 0.025 % cream SMARTSIG:2 Topical Twice Daily     amLODipine (NORVASC) 10 MG tablet Take 10 mg by mouth daily. (Patient not taking: Reported on 02/27/2021)     Dextran 70-Hypromellose 0.1-0.3 % SOLN 1 drop every 6 (six) hours as needed for Dry  Eyes. (Patient not taking: Reported on 02/27/2021)     prednisoLONE acetate (PRED FORTE) 1 % ophthalmic suspension Apply to eye. (Patient not taking: Reported on 02/27/2021)     No current facility-administered medications for this visit.    REVIEW OF SYSTEMS:  [X]  denotes positive finding, [ ]  denotes negative finding Cardiac  Comments:  Chest pain or chest pressure:    Shortness of breath upon exertion:    Short of breath when lying flat:    Irregular heart rhythm:        Vascular    Pain in calf, thigh, or hip brought on by ambulation:    Pain in feet at night that wakes you up from your sleep:     Blood clot in your veins:    Leg swelling:         Pulmonary    Oxygen at home:    Productive cough:     Wheezing:         Neurologic    Sudden weakness in arms or  legs:     Sudden numbness in arms or legs:     Sudden onset of difficulty speaking or slurred speech:    Temporary loss of vision in one eye:     Problems with dizziness:         Gastrointestinal    Blood in stool:     Vomited blood:         Genitourinary    Burning when urinating:     Blood in urine:        Psychiatric    Major depression:         Hematologic    Bleeding problems:    Problems with blood clotting too easily:        Skin    Rashes or ulcers:        Constitutional    Fever or chills:      PHYSICAL EXAM Vitals:   02/27/21 0856  BP: (!) 158/78  Pulse: (!) 52  Resp: 20  Temp: 98.5 F (36.9 C)  SpO2: 93%  Weight: 239 lb (108.4 kg)  Height: 5\' 4"  (1.626 m)    Constitutional: Elderly, but well appearing. no distress. Appears well nourished.  Neurologic: CN intact. no focal findings. no sensory loss. Psychiatric:  Mood and affect symmetric and appropriate. Eyes:  No icterus. No conjunctival pallor. Ears, nose, throat:  mucous membranes moist. Midline trachea.  Cardiac: regular rate and rhythm.  Respiratory:  unlabored. Abdominal: non-distended.  Peripheral vascular: Severe bilateral lymphedema. Eczematous skin changes in pre-tibial skin bilaterally. Extremity: Severe, bilateral non-pitting edema. No cyanosis. No pallor.  Skin: No gangrene. No ulceration.  Lymphatic: Positive Stemmer's sign. no palpable lymphadenopathy.  PERTINENT LABORATORY AND RADIOLOGIC DATA Right lower extremity venous duplex:  - No evidence of deep vein thrombosis from the common femoral through the  popliteal veins.  - No evidence of superficial venous thrombosis.  - The common femoral vein is not competent.  - The great saphenous and small saphenous veins are competent.   Yevonne Aline. Stanford Breed, MD [facility] Phone Number: [phone] 02/27/2021 10:03 AM  Total time spent on preparing this encounter including chart review, data  review, collecting history, examining the patient, coordinating care for this new patient, 60 minutes.  Portions of this report may have been transcribed using voice recognition software.  Every effort has been made to ensure accuracy; however, inadvertent computerized transcription errors may still be present.

## 2021-02-27 ENCOUNTER — Other Ambulatory Visit: Payer: Self-pay

## 2021-02-27 ENCOUNTER — Encounter: Payer: Self-pay | Admitting: Vascular Surgery

## 2021-02-27 ENCOUNTER — Ambulatory Visit (HOSPITAL_COMMUNITY)
Admission: RE | Admit: 2021-02-27 | Discharge: 2021-02-27 | Disposition: A | Discharge status: Home / Self Care | Payer: Medicare HMO | Source: Ambulatory Visit | Attending: Vascular Surgery | Admitting: Vascular Surgery

## 2021-02-27 ENCOUNTER — Ambulatory Visit: Payer: Medicare HMO | Admitting: Vascular Surgery

## 2021-02-27 VITALS — BP 158/78 | HR 52 | Temp 98.5°F | Resp 20 | Ht 64.0 in | Wt 239.0 lb

## 2021-02-27 DIAGNOSIS — M7989 Other specified soft tissue disorders: Secondary | ICD-10-CM

## 2021-02-27 DIAGNOSIS — I890 Lymphedema, not elsewhere classified: Secondary | ICD-10-CM

## 2021-02-28 DIAGNOSIS — K449 Diaphragmatic hernia without obstruction or gangrene: Secondary | ICD-10-CM | POA: Diagnosis not present

## 2021-02-28 DIAGNOSIS — M25551 Pain in right hip: Secondary | ICD-10-CM | POA: Diagnosis not present

## 2021-02-28 DIAGNOSIS — M25512 Pain in left shoulder: Secondary | ICD-10-CM | POA: Diagnosis not present

## 2021-02-28 DIAGNOSIS — G301 Alzheimer's disease with late onset: Secondary | ICD-10-CM | POA: Diagnosis not present

## 2021-02-28 DIAGNOSIS — I872 Venous insufficiency (chronic) (peripheral): Secondary | ICD-10-CM | POA: Diagnosis not present

## 2021-02-28 DIAGNOSIS — I4510 Unspecified right bundle-branch block: Secondary | ICD-10-CM | POA: Diagnosis not present

## 2021-02-28 DIAGNOSIS — R69 Illness, unspecified: Secondary | ICD-10-CM | POA: Diagnosis not present

## 2021-02-28 DIAGNOSIS — I10 Essential (primary) hypertension: Secondary | ICD-10-CM | POA: Diagnosis not present

## 2021-02-28 DIAGNOSIS — I080 Rheumatic disorders of both mitral and aortic valves: Secondary | ICD-10-CM | POA: Diagnosis not present

## 2021-02-28 DIAGNOSIS — M545 Low back pain, unspecified: Secondary | ICD-10-CM | POA: Diagnosis not present

## 2021-03-02 DIAGNOSIS — M25551 Pain in right hip: Secondary | ICD-10-CM | POA: Diagnosis not present

## 2021-03-02 DIAGNOSIS — M25512 Pain in left shoulder: Secondary | ICD-10-CM | POA: Diagnosis not present

## 2021-03-02 DIAGNOSIS — I10 Essential (primary) hypertension: Secondary | ICD-10-CM | POA: Diagnosis not present

## 2021-03-02 DIAGNOSIS — R69 Illness, unspecified: Secondary | ICD-10-CM | POA: Diagnosis not present

## 2021-03-02 DIAGNOSIS — I4510 Unspecified right bundle-branch block: Secondary | ICD-10-CM | POA: Diagnosis not present

## 2021-03-02 DIAGNOSIS — I872 Venous insufficiency (chronic) (peripheral): Secondary | ICD-10-CM | POA: Diagnosis not present

## 2021-03-02 DIAGNOSIS — G301 Alzheimer's disease with late onset: Secondary | ICD-10-CM | POA: Diagnosis not present

## 2021-03-02 DIAGNOSIS — K449 Diaphragmatic hernia without obstruction or gangrene: Secondary | ICD-10-CM | POA: Diagnosis not present

## 2021-03-02 DIAGNOSIS — I080 Rheumatic disorders of both mitral and aortic valves: Secondary | ICD-10-CM | POA: Diagnosis not present

## 2021-03-02 DIAGNOSIS — M545 Low back pain, unspecified: Secondary | ICD-10-CM | POA: Diagnosis not present

## 2021-03-06 DIAGNOSIS — M25512 Pain in left shoulder: Secondary | ICD-10-CM | POA: Diagnosis not present

## 2021-03-06 DIAGNOSIS — G301 Alzheimer's disease with late onset: Secondary | ICD-10-CM | POA: Diagnosis not present

## 2021-03-06 DIAGNOSIS — M545 Low back pain, unspecified: Secondary | ICD-10-CM | POA: Diagnosis not present

## 2021-03-06 DIAGNOSIS — R69 Illness, unspecified: Secondary | ICD-10-CM | POA: Diagnosis not present

## 2021-03-06 DIAGNOSIS — K449 Diaphragmatic hernia without obstruction or gangrene: Secondary | ICD-10-CM | POA: Diagnosis not present

## 2021-03-06 DIAGNOSIS — I10 Essential (primary) hypertension: Secondary | ICD-10-CM | POA: Diagnosis not present

## 2021-03-06 DIAGNOSIS — M25551 Pain in right hip: Secondary | ICD-10-CM | POA: Diagnosis not present

## 2021-03-06 DIAGNOSIS — I080 Rheumatic disorders of both mitral and aortic valves: Secondary | ICD-10-CM | POA: Diagnosis not present

## 2021-03-06 DIAGNOSIS — I872 Venous insufficiency (chronic) (peripheral): Secondary | ICD-10-CM | POA: Diagnosis not present

## 2021-03-06 DIAGNOSIS — I4510 Unspecified right bundle-branch block: Secondary | ICD-10-CM | POA: Diagnosis not present

## 2021-03-09 DIAGNOSIS — K449 Diaphragmatic hernia without obstruction or gangrene: Secondary | ICD-10-CM | POA: Diagnosis not present

## 2021-03-09 DIAGNOSIS — I4510 Unspecified right bundle-branch block: Secondary | ICD-10-CM | POA: Diagnosis not present

## 2021-03-09 DIAGNOSIS — M25512 Pain in left shoulder: Secondary | ICD-10-CM | POA: Diagnosis not present

## 2021-03-09 DIAGNOSIS — I872 Venous insufficiency (chronic) (peripheral): Secondary | ICD-10-CM | POA: Diagnosis not present

## 2021-03-09 DIAGNOSIS — M545 Low back pain, unspecified: Secondary | ICD-10-CM | POA: Diagnosis not present

## 2021-03-09 DIAGNOSIS — G301 Alzheimer's disease with late onset: Secondary | ICD-10-CM | POA: Diagnosis not present

## 2021-03-09 DIAGNOSIS — I10 Essential (primary) hypertension: Secondary | ICD-10-CM | POA: Diagnosis not present

## 2021-03-09 DIAGNOSIS — I080 Rheumatic disorders of both mitral and aortic valves: Secondary | ICD-10-CM | POA: Diagnosis not present

## 2021-03-09 DIAGNOSIS — M25551 Pain in right hip: Secondary | ICD-10-CM | POA: Diagnosis not present

## 2021-03-09 DIAGNOSIS — R69 Illness, unspecified: Secondary | ICD-10-CM | POA: Diagnosis not present

## 2021-03-12 DIAGNOSIS — I4510 Unspecified right bundle-branch block: Secondary | ICD-10-CM | POA: Diagnosis not present

## 2021-03-12 DIAGNOSIS — I10 Essential (primary) hypertension: Secondary | ICD-10-CM | POA: Diagnosis not present

## 2021-03-12 DIAGNOSIS — I872 Venous insufficiency (chronic) (peripheral): Secondary | ICD-10-CM | POA: Diagnosis not present

## 2021-03-12 DIAGNOSIS — M25512 Pain in left shoulder: Secondary | ICD-10-CM | POA: Diagnosis not present

## 2021-03-12 DIAGNOSIS — R69 Illness, unspecified: Secondary | ICD-10-CM | POA: Diagnosis not present

## 2021-03-12 DIAGNOSIS — K449 Diaphragmatic hernia without obstruction or gangrene: Secondary | ICD-10-CM | POA: Diagnosis not present

## 2021-03-12 DIAGNOSIS — M25551 Pain in right hip: Secondary | ICD-10-CM | POA: Diagnosis not present

## 2021-03-12 DIAGNOSIS — G301 Alzheimer's disease with late onset: Secondary | ICD-10-CM | POA: Diagnosis not present

## 2021-03-12 DIAGNOSIS — M545 Low back pain, unspecified: Secondary | ICD-10-CM | POA: Diagnosis not present

## 2021-03-12 DIAGNOSIS — I080 Rheumatic disorders of both mitral and aortic valves: Secondary | ICD-10-CM | POA: Diagnosis not present

## 2021-03-13 DIAGNOSIS — M25512 Pain in left shoulder: Secondary | ICD-10-CM | POA: Diagnosis not present

## 2021-03-13 DIAGNOSIS — G301 Alzheimer's disease with late onset: Secondary | ICD-10-CM | POA: Diagnosis not present

## 2021-03-13 DIAGNOSIS — M545 Low back pain, unspecified: Secondary | ICD-10-CM | POA: Diagnosis not present

## 2021-03-13 DIAGNOSIS — K449 Diaphragmatic hernia without obstruction or gangrene: Secondary | ICD-10-CM | POA: Diagnosis not present

## 2021-03-13 DIAGNOSIS — I080 Rheumatic disorders of both mitral and aortic valves: Secondary | ICD-10-CM | POA: Diagnosis not present

## 2021-03-13 DIAGNOSIS — I872 Venous insufficiency (chronic) (peripheral): Secondary | ICD-10-CM | POA: Diagnosis not present

## 2021-03-13 DIAGNOSIS — I4510 Unspecified right bundle-branch block: Secondary | ICD-10-CM | POA: Diagnosis not present

## 2021-03-13 DIAGNOSIS — M25551 Pain in right hip: Secondary | ICD-10-CM | POA: Diagnosis not present

## 2021-03-13 DIAGNOSIS — R69 Illness, unspecified: Secondary | ICD-10-CM | POA: Diagnosis not present

## 2021-03-13 DIAGNOSIS — I10 Essential (primary) hypertension: Secondary | ICD-10-CM | POA: Diagnosis not present

## 2021-03-19 DIAGNOSIS — I10 Essential (primary) hypertension: Secondary | ICD-10-CM | POA: Diagnosis not present

## 2021-03-19 DIAGNOSIS — I080 Rheumatic disorders of both mitral and aortic valves: Secondary | ICD-10-CM | POA: Diagnosis not present

## 2021-03-19 DIAGNOSIS — G301 Alzheimer's disease with late onset: Secondary | ICD-10-CM | POA: Diagnosis not present

## 2021-03-19 DIAGNOSIS — K449 Diaphragmatic hernia without obstruction or gangrene: Secondary | ICD-10-CM | POA: Diagnosis not present

## 2021-03-19 DIAGNOSIS — M545 Low back pain, unspecified: Secondary | ICD-10-CM | POA: Diagnosis not present

## 2021-03-19 DIAGNOSIS — I4510 Unspecified right bundle-branch block: Secondary | ICD-10-CM | POA: Diagnosis not present

## 2021-03-19 DIAGNOSIS — M25512 Pain in left shoulder: Secondary | ICD-10-CM | POA: Diagnosis not present

## 2021-03-19 DIAGNOSIS — I872 Venous insufficiency (chronic) (peripheral): Secondary | ICD-10-CM | POA: Diagnosis not present

## 2021-03-19 DIAGNOSIS — R69 Illness, unspecified: Secondary | ICD-10-CM | POA: Diagnosis not present

## 2021-03-19 DIAGNOSIS — M25551 Pain in right hip: Secondary | ICD-10-CM | POA: Diagnosis not present

## 2021-03-20 DIAGNOSIS — I872 Venous insufficiency (chronic) (peripheral): Secondary | ICD-10-CM | POA: Diagnosis not present

## 2021-03-20 DIAGNOSIS — I4510 Unspecified right bundle-branch block: Secondary | ICD-10-CM | POA: Diagnosis not present

## 2021-03-20 DIAGNOSIS — M545 Low back pain, unspecified: Secondary | ICD-10-CM | POA: Diagnosis not present

## 2021-03-20 DIAGNOSIS — I10 Essential (primary) hypertension: Secondary | ICD-10-CM | POA: Diagnosis not present

## 2021-03-20 DIAGNOSIS — M25551 Pain in right hip: Secondary | ICD-10-CM | POA: Diagnosis not present

## 2021-03-20 DIAGNOSIS — G301 Alzheimer's disease with late onset: Secondary | ICD-10-CM | POA: Diagnosis not present

## 2021-03-20 DIAGNOSIS — I080 Rheumatic disorders of both mitral and aortic valves: Secondary | ICD-10-CM | POA: Diagnosis not present

## 2021-03-20 DIAGNOSIS — K449 Diaphragmatic hernia without obstruction or gangrene: Secondary | ICD-10-CM | POA: Diagnosis not present

## 2021-03-20 DIAGNOSIS — M25512 Pain in left shoulder: Secondary | ICD-10-CM | POA: Diagnosis not present

## 2021-03-20 DIAGNOSIS — R69 Illness, unspecified: Secondary | ICD-10-CM | POA: Diagnosis not present

## 2021-03-27 DIAGNOSIS — I872 Venous insufficiency (chronic) (peripheral): Secondary | ICD-10-CM | POA: Diagnosis not present

## 2021-03-27 DIAGNOSIS — G301 Alzheimer's disease with late onset: Secondary | ICD-10-CM | POA: Diagnosis not present

## 2021-03-27 DIAGNOSIS — M545 Low back pain, unspecified: Secondary | ICD-10-CM | POA: Diagnosis not present

## 2021-03-27 DIAGNOSIS — R269 Unspecified abnormalities of gait and mobility: Secondary | ICD-10-CM | POA: Diagnosis not present

## 2021-03-27 DIAGNOSIS — I080 Rheumatic disorders of both mitral and aortic valves: Secondary | ICD-10-CM | POA: Diagnosis not present

## 2021-03-27 DIAGNOSIS — I4510 Unspecified right bundle-branch block: Secondary | ICD-10-CM | POA: Diagnosis not present

## 2021-03-27 DIAGNOSIS — M25551 Pain in right hip: Secondary | ICD-10-CM | POA: Diagnosis not present

## 2021-03-27 DIAGNOSIS — M25512 Pain in left shoulder: Secondary | ICD-10-CM | POA: Diagnosis not present

## 2021-03-27 DIAGNOSIS — K449 Diaphragmatic hernia without obstruction or gangrene: Secondary | ICD-10-CM | POA: Diagnosis not present

## 2021-03-27 DIAGNOSIS — R69 Illness, unspecified: Secondary | ICD-10-CM | POA: Diagnosis not present

## 2021-03-27 DIAGNOSIS — I10 Essential (primary) hypertension: Secondary | ICD-10-CM | POA: Diagnosis not present

## 2021-03-28 DIAGNOSIS — R69 Illness, unspecified: Secondary | ICD-10-CM | POA: Diagnosis not present

## 2021-03-28 DIAGNOSIS — I10 Essential (primary) hypertension: Secondary | ICD-10-CM | POA: Diagnosis not present

## 2021-03-28 DIAGNOSIS — I4510 Unspecified right bundle-branch block: Secondary | ICD-10-CM | POA: Diagnosis not present

## 2021-03-28 DIAGNOSIS — I872 Venous insufficiency (chronic) (peripheral): Secondary | ICD-10-CM | POA: Diagnosis not present

## 2021-03-28 DIAGNOSIS — K449 Diaphragmatic hernia without obstruction or gangrene: Secondary | ICD-10-CM | POA: Diagnosis not present

## 2021-03-28 DIAGNOSIS — M25512 Pain in left shoulder: Secondary | ICD-10-CM | POA: Diagnosis not present

## 2021-03-28 DIAGNOSIS — G301 Alzheimer's disease with late onset: Secondary | ICD-10-CM | POA: Diagnosis not present

## 2021-03-28 DIAGNOSIS — I080 Rheumatic disorders of both mitral and aortic valves: Secondary | ICD-10-CM | POA: Diagnosis not present

## 2021-03-28 DIAGNOSIS — M25551 Pain in right hip: Secondary | ICD-10-CM | POA: Diagnosis not present

## 2021-03-28 DIAGNOSIS — M545 Low back pain, unspecified: Secondary | ICD-10-CM | POA: Diagnosis not present

## 2021-04-03 DIAGNOSIS — M25512 Pain in left shoulder: Secondary | ICD-10-CM | POA: Diagnosis not present

## 2021-04-03 DIAGNOSIS — K449 Diaphragmatic hernia without obstruction or gangrene: Secondary | ICD-10-CM | POA: Diagnosis not present

## 2021-04-03 DIAGNOSIS — I4510 Unspecified right bundle-branch block: Secondary | ICD-10-CM | POA: Diagnosis not present

## 2021-04-03 DIAGNOSIS — R69 Illness, unspecified: Secondary | ICD-10-CM | POA: Diagnosis not present

## 2021-04-03 DIAGNOSIS — I872 Venous insufficiency (chronic) (peripheral): Secondary | ICD-10-CM | POA: Diagnosis not present

## 2021-04-03 DIAGNOSIS — I10 Essential (primary) hypertension: Secondary | ICD-10-CM | POA: Diagnosis not present

## 2021-04-03 DIAGNOSIS — I080 Rheumatic disorders of both mitral and aortic valves: Secondary | ICD-10-CM | POA: Diagnosis not present

## 2021-04-03 DIAGNOSIS — M25551 Pain in right hip: Secondary | ICD-10-CM | POA: Diagnosis not present

## 2021-04-03 DIAGNOSIS — M545 Low back pain, unspecified: Secondary | ICD-10-CM | POA: Diagnosis not present

## 2021-04-03 DIAGNOSIS — G301 Alzheimer's disease with late onset: Secondary | ICD-10-CM | POA: Diagnosis not present

## 2021-04-06 DIAGNOSIS — I4510 Unspecified right bundle-branch block: Secondary | ICD-10-CM | POA: Diagnosis not present

## 2021-04-06 DIAGNOSIS — M25551 Pain in right hip: Secondary | ICD-10-CM | POA: Diagnosis not present

## 2021-04-06 DIAGNOSIS — M25512 Pain in left shoulder: Secondary | ICD-10-CM | POA: Diagnosis not present

## 2021-04-06 DIAGNOSIS — R69 Illness, unspecified: Secondary | ICD-10-CM | POA: Diagnosis not present

## 2021-04-06 DIAGNOSIS — K449 Diaphragmatic hernia without obstruction or gangrene: Secondary | ICD-10-CM | POA: Diagnosis not present

## 2021-04-06 DIAGNOSIS — I872 Venous insufficiency (chronic) (peripheral): Secondary | ICD-10-CM | POA: Diagnosis not present

## 2021-04-06 DIAGNOSIS — I10 Essential (primary) hypertension: Secondary | ICD-10-CM | POA: Diagnosis not present

## 2021-04-06 DIAGNOSIS — G301 Alzheimer's disease with late onset: Secondary | ICD-10-CM | POA: Diagnosis not present

## 2021-04-06 DIAGNOSIS — I080 Rheumatic disorders of both mitral and aortic valves: Secondary | ICD-10-CM | POA: Diagnosis not present

## 2021-04-06 DIAGNOSIS — M545 Low back pain, unspecified: Secondary | ICD-10-CM | POA: Diagnosis not present

## 2021-04-13 ENCOUNTER — Telehealth: Payer: Self-pay

## 2021-04-13 DIAGNOSIS — I872 Venous insufficiency (chronic) (peripheral): Secondary | ICD-10-CM | POA: Diagnosis not present

## 2021-04-13 DIAGNOSIS — M25512 Pain in left shoulder: Secondary | ICD-10-CM | POA: Diagnosis not present

## 2021-04-13 DIAGNOSIS — I10 Essential (primary) hypertension: Secondary | ICD-10-CM | POA: Diagnosis not present

## 2021-04-13 DIAGNOSIS — I080 Rheumatic disorders of both mitral and aortic valves: Secondary | ICD-10-CM | POA: Diagnosis not present

## 2021-04-13 DIAGNOSIS — G301 Alzheimer's disease with late onset: Secondary | ICD-10-CM | POA: Diagnosis not present

## 2021-04-13 DIAGNOSIS — R69 Illness, unspecified: Secondary | ICD-10-CM | POA: Diagnosis not present

## 2021-04-13 DIAGNOSIS — K449 Diaphragmatic hernia without obstruction or gangrene: Secondary | ICD-10-CM | POA: Diagnosis not present

## 2021-04-13 DIAGNOSIS — M545 Low back pain, unspecified: Secondary | ICD-10-CM | POA: Diagnosis not present

## 2021-04-13 DIAGNOSIS — I4510 Unspecified right bundle-branch block: Secondary | ICD-10-CM | POA: Diagnosis not present

## 2021-04-13 DIAGNOSIS — M25551 Pain in right hip: Secondary | ICD-10-CM | POA: Diagnosis not present

## 2021-04-13 NOTE — Telephone Encounter (Signed)
Patient's daughter Holly Hawkins called wanting to know if patient can use Juxta lite compression wraps on her legs.  Per Fransisco Beau., Insurance normally does not cover compression wraps or hoses.  It is fine if she wants to order this wrap.  She will make pt aware. ?

## 2021-05-01 DIAGNOSIS — Z87891 Personal history of nicotine dependence: Secondary | ICD-10-CM | POA: Diagnosis not present

## 2021-05-01 DIAGNOSIS — R7303 Prediabetes: Secondary | ICD-10-CM | POA: Diagnosis not present

## 2021-05-01 DIAGNOSIS — I080 Rheumatic disorders of both mitral and aortic valves: Secondary | ICD-10-CM | POA: Diagnosis not present

## 2021-05-01 DIAGNOSIS — E041 Nontoxic single thyroid nodule: Secondary | ICD-10-CM | POA: Diagnosis not present

## 2021-05-01 DIAGNOSIS — K449 Diaphragmatic hernia without obstruction or gangrene: Secondary | ICD-10-CM | POA: Diagnosis not present

## 2021-05-01 DIAGNOSIS — D649 Anemia, unspecified: Secondary | ICD-10-CM | POA: Diagnosis not present

## 2021-05-01 DIAGNOSIS — Z9049 Acquired absence of other specified parts of digestive tract: Secondary | ICD-10-CM | POA: Diagnosis not present

## 2021-05-01 DIAGNOSIS — K7689 Other specified diseases of liver: Secondary | ICD-10-CM | POA: Diagnosis not present

## 2021-05-01 DIAGNOSIS — Z86010 Personal history of colonic polyps: Secondary | ICD-10-CM | POA: Diagnosis not present

## 2021-05-01 DIAGNOSIS — Z85038 Personal history of other malignant neoplasm of large intestine: Secondary | ICD-10-CM | POA: Diagnosis not present

## 2021-05-01 DIAGNOSIS — Z90710 Acquired absence of both cervix and uterus: Secondary | ICD-10-CM | POA: Diagnosis not present

## 2021-05-01 DIAGNOSIS — G301 Alzheimer's disease with late onset: Secondary | ICD-10-CM | POA: Diagnosis not present

## 2021-05-01 DIAGNOSIS — H543 Unqualified visual loss, both eyes: Secondary | ICD-10-CM | POA: Diagnosis not present

## 2021-05-01 DIAGNOSIS — H35341 Macular cyst, hole, or pseudohole, right eye: Secondary | ICD-10-CM | POA: Diagnosis not present

## 2021-05-01 DIAGNOSIS — R300 Dysuria: Secondary | ICD-10-CM | POA: Diagnosis not present

## 2021-05-01 DIAGNOSIS — I890 Lymphedema, not elsewhere classified: Secondary | ICD-10-CM | POA: Diagnosis not present

## 2021-05-01 DIAGNOSIS — I10 Essential (primary) hypertension: Secondary | ICD-10-CM | POA: Diagnosis not present

## 2021-05-01 DIAGNOSIS — F02A0 Dementia in other diseases classified elsewhere, mild, without behavioral disturbance, psychotic disturbance, mood disturbance, and anxiety: Secondary | ICD-10-CM | POA: Diagnosis not present

## 2021-05-01 DIAGNOSIS — I4510 Unspecified right bundle-branch block: Secondary | ICD-10-CM | POA: Diagnosis not present

## 2021-05-01 DIAGNOSIS — I872 Venous insufficiency (chronic) (peripheral): Secondary | ICD-10-CM | POA: Diagnosis not present

## 2021-05-01 DIAGNOSIS — E7800 Pure hypercholesterolemia, unspecified: Secondary | ICD-10-CM | POA: Diagnosis not present

## 2021-05-08 DIAGNOSIS — E7800 Pure hypercholesterolemia, unspecified: Secondary | ICD-10-CM | POA: Diagnosis not present

## 2021-05-08 DIAGNOSIS — Z85038 Personal history of other malignant neoplasm of large intestine: Secondary | ICD-10-CM | POA: Diagnosis not present

## 2021-05-08 DIAGNOSIS — Z90710 Acquired absence of both cervix and uterus: Secondary | ICD-10-CM | POA: Diagnosis not present

## 2021-05-08 DIAGNOSIS — H543 Unqualified visual loss, both eyes: Secondary | ICD-10-CM | POA: Diagnosis not present

## 2021-05-08 DIAGNOSIS — I872 Venous insufficiency (chronic) (peripheral): Secondary | ICD-10-CM | POA: Diagnosis not present

## 2021-05-08 DIAGNOSIS — G301 Alzheimer's disease with late onset: Secondary | ICD-10-CM | POA: Diagnosis not present

## 2021-05-08 DIAGNOSIS — H35341 Macular cyst, hole, or pseudohole, right eye: Secondary | ICD-10-CM | POA: Diagnosis not present

## 2021-05-08 DIAGNOSIS — I10 Essential (primary) hypertension: Secondary | ICD-10-CM | POA: Diagnosis not present

## 2021-05-08 DIAGNOSIS — K7689 Other specified diseases of liver: Secondary | ICD-10-CM | POA: Diagnosis not present

## 2021-05-08 DIAGNOSIS — Z86010 Personal history of colonic polyps: Secondary | ICD-10-CM | POA: Diagnosis not present

## 2021-05-08 DIAGNOSIS — F02A0 Dementia in other diseases classified elsewhere, mild, without behavioral disturbance, psychotic disturbance, mood disturbance, and anxiety: Secondary | ICD-10-CM | POA: Diagnosis not present

## 2021-05-08 DIAGNOSIS — I4510 Unspecified right bundle-branch block: Secondary | ICD-10-CM | POA: Diagnosis not present

## 2021-05-08 DIAGNOSIS — R7303 Prediabetes: Secondary | ICD-10-CM | POA: Diagnosis not present

## 2021-05-08 DIAGNOSIS — D649 Anemia, unspecified: Secondary | ICD-10-CM | POA: Diagnosis not present

## 2021-05-08 DIAGNOSIS — Z9049 Acquired absence of other specified parts of digestive tract: Secondary | ICD-10-CM | POA: Diagnosis not present

## 2021-05-08 DIAGNOSIS — K449 Diaphragmatic hernia without obstruction or gangrene: Secondary | ICD-10-CM | POA: Diagnosis not present

## 2021-05-08 DIAGNOSIS — I890 Lymphedema, not elsewhere classified: Secondary | ICD-10-CM | POA: Diagnosis not present

## 2021-05-08 DIAGNOSIS — E041 Nontoxic single thyroid nodule: Secondary | ICD-10-CM | POA: Diagnosis not present

## 2021-05-08 DIAGNOSIS — Z87891 Personal history of nicotine dependence: Secondary | ICD-10-CM | POA: Diagnosis not present

## 2021-05-08 DIAGNOSIS — I080 Rheumatic disorders of both mitral and aortic valves: Secondary | ICD-10-CM | POA: Diagnosis not present

## 2021-05-10 DIAGNOSIS — R300 Dysuria: Secondary | ICD-10-CM | POA: Diagnosis not present

## 2021-10-03 DIAGNOSIS — G301 Alzheimer's disease with late onset: Secondary | ICD-10-CM | POA: Diagnosis not present

## 2021-10-03 DIAGNOSIS — Z6841 Body Mass Index (BMI) 40.0 and over, adult: Secondary | ICD-10-CM | POA: Diagnosis not present

## 2021-10-03 DIAGNOSIS — R269 Unspecified abnormalities of gait and mobility: Secondary | ICD-10-CM | POA: Diagnosis not present

## 2021-10-03 DIAGNOSIS — H542X11 Low vision right eye category 1, low vision left eye category 1: Secondary | ICD-10-CM | POA: Diagnosis not present

## 2021-10-03 DIAGNOSIS — I10 Essential (primary) hypertension: Secondary | ICD-10-CM | POA: Diagnosis not present

## 2021-10-16 DIAGNOSIS — R269 Unspecified abnormalities of gait and mobility: Secondary | ICD-10-CM | POA: Diagnosis not present

## 2021-10-16 DIAGNOSIS — H542X11 Low vision right eye category 1, low vision left eye category 1: Secondary | ICD-10-CM | POA: Diagnosis not present

## 2021-11-05 DIAGNOSIS — E119 Type 2 diabetes mellitus without complications: Secondary | ICD-10-CM | POA: Diagnosis not present

## 2021-11-05 DIAGNOSIS — E559 Vitamin D deficiency, unspecified: Secondary | ICD-10-CM | POA: Diagnosis not present

## 2021-11-06 DIAGNOSIS — G309 Alzheimer's disease, unspecified: Secondary | ICD-10-CM | POA: Diagnosis not present

## 2021-11-06 DIAGNOSIS — R69 Illness, unspecified: Secondary | ICD-10-CM | POA: Diagnosis not present

## 2021-11-06 DIAGNOSIS — Z6841 Body Mass Index (BMI) 40.0 and over, adult: Secondary | ICD-10-CM | POA: Diagnosis not present

## 2021-11-06 DIAGNOSIS — R609 Edema, unspecified: Secondary | ICD-10-CM | POA: Diagnosis not present

## 2021-11-06 DIAGNOSIS — I10 Essential (primary) hypertension: Secondary | ICD-10-CM | POA: Diagnosis not present

## 2021-11-06 DIAGNOSIS — H547 Unspecified visual loss: Secondary | ICD-10-CM | POA: Diagnosis not present

## 2021-11-06 DIAGNOSIS — I890 Lymphedema, not elsewhere classified: Secondary | ICD-10-CM | POA: Diagnosis not present

## 2021-11-06 DIAGNOSIS — L84 Corns and callosities: Secondary | ICD-10-CM | POA: Diagnosis not present

## 2021-11-12 DIAGNOSIS — F4322 Adjustment disorder with anxiety: Secondary | ICD-10-CM | POA: Diagnosis not present

## 2021-11-12 DIAGNOSIS — R69 Illness, unspecified: Secondary | ICD-10-CM | POA: Diagnosis not present

## 2021-11-12 DIAGNOSIS — F03B11 Unspecified dementia, moderate, with agitation: Secondary | ICD-10-CM | POA: Diagnosis not present

## 2021-11-12 DIAGNOSIS — F5102 Adjustment insomnia: Secondary | ICD-10-CM | POA: Diagnosis not present

## 2021-11-15 DIAGNOSIS — H542X11 Low vision right eye category 1, low vision left eye category 1: Secondary | ICD-10-CM | POA: Diagnosis not present

## 2021-11-15 DIAGNOSIS — R269 Unspecified abnormalities of gait and mobility: Secondary | ICD-10-CM | POA: Diagnosis not present

## 2021-12-07 DIAGNOSIS — F0280 Dementia in other diseases classified elsewhere without behavioral disturbance: Secondary | ICD-10-CM | POA: Diagnosis not present

## 2021-12-07 DIAGNOSIS — M6281 Muscle weakness (generalized): Secondary | ICD-10-CM | POA: Diagnosis not present

## 2021-12-07 DIAGNOSIS — R2689 Other abnormalities of gait and mobility: Secondary | ICD-10-CM | POA: Diagnosis not present

## 2021-12-10 DIAGNOSIS — R2689 Other abnormalities of gait and mobility: Secondary | ICD-10-CM | POA: Diagnosis not present

## 2021-12-10 DIAGNOSIS — F0280 Dementia in other diseases classified elsewhere without behavioral disturbance: Secondary | ICD-10-CM | POA: Diagnosis not present

## 2021-12-10 DIAGNOSIS — M6281 Muscle weakness (generalized): Secondary | ICD-10-CM | POA: Diagnosis not present

## 2021-12-10 DIAGNOSIS — F03B11 Unspecified dementia, moderate, with agitation: Secondary | ICD-10-CM | POA: Diagnosis not present

## 2021-12-11 DIAGNOSIS — F0280 Dementia in other diseases classified elsewhere without behavioral disturbance: Secondary | ICD-10-CM | POA: Diagnosis not present

## 2021-12-11 DIAGNOSIS — R2689 Other abnormalities of gait and mobility: Secondary | ICD-10-CM | POA: Diagnosis not present

## 2021-12-11 DIAGNOSIS — M6281 Muscle weakness (generalized): Secondary | ICD-10-CM | POA: Diagnosis not present

## 2021-12-12 DIAGNOSIS — R2689 Other abnormalities of gait and mobility: Secondary | ICD-10-CM | POA: Diagnosis not present

## 2021-12-12 DIAGNOSIS — M6281 Muscle weakness (generalized): Secondary | ICD-10-CM | POA: Diagnosis not present

## 2021-12-12 DIAGNOSIS — F0280 Dementia in other diseases classified elsewhere without behavioral disturbance: Secondary | ICD-10-CM | POA: Diagnosis not present

## 2021-12-13 DIAGNOSIS — M6281 Muscle weakness (generalized): Secondary | ICD-10-CM | POA: Diagnosis not present

## 2021-12-13 DIAGNOSIS — R2689 Other abnormalities of gait and mobility: Secondary | ICD-10-CM | POA: Diagnosis not present

## 2021-12-13 DIAGNOSIS — F0280 Dementia in other diseases classified elsewhere without behavioral disturbance: Secondary | ICD-10-CM | POA: Diagnosis not present

## 2021-12-14 DIAGNOSIS — R2689 Other abnormalities of gait and mobility: Secondary | ICD-10-CM | POA: Diagnosis not present

## 2021-12-14 DIAGNOSIS — M6281 Muscle weakness (generalized): Secondary | ICD-10-CM | POA: Diagnosis not present

## 2021-12-14 DIAGNOSIS — F0280 Dementia in other diseases classified elsewhere without behavioral disturbance: Secondary | ICD-10-CM | POA: Diagnosis not present

## 2021-12-17 DIAGNOSIS — M6281 Muscle weakness (generalized): Secondary | ICD-10-CM | POA: Diagnosis not present

## 2021-12-17 DIAGNOSIS — R2689 Other abnormalities of gait and mobility: Secondary | ICD-10-CM | POA: Diagnosis not present

## 2021-12-17 DIAGNOSIS — F0280 Dementia in other diseases classified elsewhere without behavioral disturbance: Secondary | ICD-10-CM | POA: Diagnosis not present

## 2021-12-18 DIAGNOSIS — F0280 Dementia in other diseases classified elsewhere without behavioral disturbance: Secondary | ICD-10-CM | POA: Diagnosis not present

## 2021-12-18 DIAGNOSIS — M6281 Muscle weakness (generalized): Secondary | ICD-10-CM | POA: Diagnosis not present

## 2021-12-18 DIAGNOSIS — R2689 Other abnormalities of gait and mobility: Secondary | ICD-10-CM | POA: Diagnosis not present

## 2021-12-19 DIAGNOSIS — F0280 Dementia in other diseases classified elsewhere without behavioral disturbance: Secondary | ICD-10-CM | POA: Diagnosis not present

## 2021-12-19 DIAGNOSIS — M6281 Muscle weakness (generalized): Secondary | ICD-10-CM | POA: Diagnosis not present

## 2021-12-19 DIAGNOSIS — R2689 Other abnormalities of gait and mobility: Secondary | ICD-10-CM | POA: Diagnosis not present

## 2021-12-20 DIAGNOSIS — M6281 Muscle weakness (generalized): Secondary | ICD-10-CM | POA: Diagnosis not present

## 2021-12-20 DIAGNOSIS — F0280 Dementia in other diseases classified elsewhere without behavioral disturbance: Secondary | ICD-10-CM | POA: Diagnosis not present

## 2021-12-20 DIAGNOSIS — R2689 Other abnormalities of gait and mobility: Secondary | ICD-10-CM | POA: Diagnosis not present

## 2021-12-21 DIAGNOSIS — M6281 Muscle weakness (generalized): Secondary | ICD-10-CM | POA: Diagnosis not present

## 2021-12-21 DIAGNOSIS — R2689 Other abnormalities of gait and mobility: Secondary | ICD-10-CM | POA: Diagnosis not present

## 2021-12-21 DIAGNOSIS — F0280 Dementia in other diseases classified elsewhere without behavioral disturbance: Secondary | ICD-10-CM | POA: Diagnosis not present

## 2021-12-24 DIAGNOSIS — F0280 Dementia in other diseases classified elsewhere without behavioral disturbance: Secondary | ICD-10-CM | POA: Diagnosis not present

## 2021-12-24 DIAGNOSIS — R2689 Other abnormalities of gait and mobility: Secondary | ICD-10-CM | POA: Diagnosis not present

## 2021-12-24 DIAGNOSIS — M6281 Muscle weakness (generalized): Secondary | ICD-10-CM | POA: Diagnosis not present

## 2021-12-25 DIAGNOSIS — R2689 Other abnormalities of gait and mobility: Secondary | ICD-10-CM | POA: Diagnosis not present

## 2021-12-25 DIAGNOSIS — M6281 Muscle weakness (generalized): Secondary | ICD-10-CM | POA: Diagnosis not present

## 2021-12-25 DIAGNOSIS — F0280 Dementia in other diseases classified elsewhere without behavioral disturbance: Secondary | ICD-10-CM | POA: Diagnosis not present

## 2021-12-26 DIAGNOSIS — F0280 Dementia in other diseases classified elsewhere without behavioral disturbance: Secondary | ICD-10-CM | POA: Diagnosis not present

## 2021-12-26 DIAGNOSIS — R2689 Other abnormalities of gait and mobility: Secondary | ICD-10-CM | POA: Diagnosis not present

## 2021-12-26 DIAGNOSIS — M6281 Muscle weakness (generalized): Secondary | ICD-10-CM | POA: Diagnosis not present

## 2021-12-27 DIAGNOSIS — R2689 Other abnormalities of gait and mobility: Secondary | ICD-10-CM | POA: Diagnosis not present

## 2021-12-27 DIAGNOSIS — M6281 Muscle weakness (generalized): Secondary | ICD-10-CM | POA: Diagnosis not present

## 2021-12-27 DIAGNOSIS — F0280 Dementia in other diseases classified elsewhere without behavioral disturbance: Secondary | ICD-10-CM | POA: Diagnosis not present

## 2022-02-05 DIAGNOSIS — F0280 Dementia in other diseases classified elsewhere without behavioral disturbance: Secondary | ICD-10-CM | POA: Diagnosis not present

## 2022-02-05 DIAGNOSIS — H547 Unspecified visual loss: Secondary | ICD-10-CM | POA: Diagnosis not present

## 2022-02-05 DIAGNOSIS — G309 Alzheimer's disease, unspecified: Secondary | ICD-10-CM | POA: Diagnosis not present

## 2022-02-05 DIAGNOSIS — I890 Lymphedema, not elsewhere classified: Secondary | ICD-10-CM | POA: Diagnosis not present

## 2022-02-28 DEATH — deceased
